# Patient Record
Sex: Female | Born: 1960 | Race: White | Hispanic: No | Marital: Married | Smoking: Never smoker
Health system: Southern US, Community
[De-identification: ages and names within clinical notes are randomized; demographics above are authoritative.]

## PROBLEM LIST (undated history)

## (undated) DIAGNOSIS — M1990 Unspecified osteoarthritis, unspecified site: Secondary | ICD-10-CM

## (undated) DIAGNOSIS — Z853 Personal history of malignant neoplasm of breast: Secondary | ICD-10-CM

## (undated) DIAGNOSIS — M797 Fibromyalgia: Secondary | ICD-10-CM

## (undated) DIAGNOSIS — C50919 Malignant neoplasm of unspecified site of unspecified female breast: Secondary | ICD-10-CM

## (undated) DIAGNOSIS — M5430 Sciatica, unspecified side: Secondary | ICD-10-CM

## (undated) DIAGNOSIS — C50419 Malignant neoplasm of upper-outer quadrant of unspecified female breast: Secondary | ICD-10-CM

## (undated) DIAGNOSIS — Z1239 Encounter for other screening for malignant neoplasm of breast: Secondary | ICD-10-CM

## (undated) DIAGNOSIS — G5600 Carpal tunnel syndrome, unspecified upper limb: Secondary | ICD-10-CM

## (undated) HISTORY — DX: Malignant neoplasm of unspecified site of unspecified female breast: C50.919

## (undated) HISTORY — DX: Fibromyalgia: M79.7

## (undated) HISTORY — DX: Personal history of malignant neoplasm of breast: Z85.3

## (undated) HISTORY — DX: Sciatica, unspecified side: M54.30

## (undated) HISTORY — DX: Carpal tunnel syndrome, unspecified upper limb: G56.00

## (undated) HISTORY — DX: Encounter for other screening for malignant neoplasm of breast: Z12.39

## (undated) HISTORY — DX: Malignant neoplasm of upper-outer quadrant of unspecified female breast: C50.419

## (undated) HISTORY — DX: Unspecified osteoarthritis, unspecified site: M19.90

---

## 1999-11-18 DIAGNOSIS — M797 Fibromyalgia: Secondary | ICD-10-CM

## 1999-11-18 DIAGNOSIS — G5600 Carpal tunnel syndrome, unspecified upper limb: Secondary | ICD-10-CM

## 1999-11-18 HISTORY — PX: CARPAL TUNNEL RELEASE: SHX101

## 1999-11-18 HISTORY — DX: Carpal tunnel syndrome, unspecified upper limb: G56.00

## 1999-11-18 HISTORY — DX: Fibromyalgia: M79.7

## 2000-04-27 ENCOUNTER — Encounter: Admission: RE | Admit: 2000-04-27 | Discharge: 2000-07-26 | Payer: Self-pay | Admitting: Family Medicine

## 2000-05-27 ENCOUNTER — Other Ambulatory Visit: Admission: RE | Admit: 2000-05-27 | Discharge: 2000-05-27 | Payer: Self-pay | Admitting: Family Medicine

## 2001-10-12 ENCOUNTER — Other Ambulatory Visit: Admission: RE | Admit: 2001-10-12 | Discharge: 2001-10-12 | Payer: Self-pay | Admitting: Family Medicine

## 2001-12-03 ENCOUNTER — Ambulatory Visit (HOSPITAL_COMMUNITY): Admission: RE | Admit: 2001-12-03 | Discharge: 2001-12-03 | Payer: Self-pay

## 2001-12-03 ENCOUNTER — Encounter: Payer: Self-pay | Admitting: Family Medicine

## 2002-02-18 ENCOUNTER — Encounter: Payer: Self-pay | Admitting: Family Medicine

## 2002-02-18 ENCOUNTER — Ambulatory Visit (HOSPITAL_COMMUNITY): Admission: RE | Admit: 2002-02-18 | Discharge: 2002-02-18 | Payer: Self-pay | Admitting: Family Medicine

## 2002-11-17 DIAGNOSIS — M1990 Unspecified osteoarthritis, unspecified site: Secondary | ICD-10-CM

## 2002-11-17 HISTORY — DX: Unspecified osteoarthritis, unspecified site: M19.90

## 2009-11-17 DIAGNOSIS — C50419 Malignant neoplasm of upper-outer quadrant of unspecified female breast: Secondary | ICD-10-CM

## 2009-11-17 DIAGNOSIS — Z853 Personal history of malignant neoplasm of breast: Secondary | ICD-10-CM

## 2009-11-17 HISTORY — DX: Personal history of malignant neoplasm of breast: Z85.3

## 2009-11-17 HISTORY — DX: Malignant neoplasm of upper-outer quadrant of unspecified female breast: C50.419

## 2009-11-17 HISTORY — PX: BREAST SURGERY: SHX581

## 2009-12-27 ENCOUNTER — Ambulatory Visit: Payer: Self-pay | Admitting: Family Medicine

## 2010-01-09 ENCOUNTER — Ambulatory Visit: Payer: Self-pay | Admitting: General Surgery

## 2010-01-10 ENCOUNTER — Ambulatory Visit: Payer: Self-pay | Admitting: General Surgery

## 2010-01-15 ENCOUNTER — Ambulatory Visit: Payer: Self-pay | Admitting: Oncology

## 2010-01-24 ENCOUNTER — Ambulatory Visit: Payer: Self-pay | Admitting: Oncology

## 2010-02-15 ENCOUNTER — Ambulatory Visit: Payer: Self-pay | Admitting: Oncology

## 2010-03-17 ENCOUNTER — Ambulatory Visit: Payer: Self-pay | Admitting: Oncology

## 2010-04-17 ENCOUNTER — Ambulatory Visit: Payer: Self-pay | Admitting: Oncology

## 2010-05-17 ENCOUNTER — Ambulatory Visit: Payer: Self-pay | Admitting: Oncology

## 2010-06-17 ENCOUNTER — Ambulatory Visit: Payer: Self-pay | Admitting: Oncology

## 2010-06-27 ENCOUNTER — Ambulatory Visit: Payer: Self-pay | Admitting: General Surgery

## 2010-09-12 ENCOUNTER — Ambulatory Visit: Payer: Self-pay | Admitting: Oncology

## 2010-09-17 ENCOUNTER — Ambulatory Visit: Payer: Self-pay | Admitting: Oncology

## 2010-11-17 DIAGNOSIS — M5430 Sciatica, unspecified side: Secondary | ICD-10-CM

## 2010-11-17 HISTORY — DX: Sciatica, unspecified side: M54.30

## 2010-12-11 ENCOUNTER — Ambulatory Visit: Payer: Self-pay | Admitting: Radiation Oncology

## 2010-12-18 ENCOUNTER — Ambulatory Visit: Payer: Self-pay | Admitting: Radiation Oncology

## 2011-01-02 ENCOUNTER — Ambulatory Visit: Payer: Self-pay | Admitting: General Surgery

## 2011-04-17 ENCOUNTER — Ambulatory Visit: Payer: Self-pay | Admitting: Physical Medicine and Rehabilitation

## 2011-08-04 ENCOUNTER — Ambulatory Visit: Payer: Self-pay | Admitting: General Surgery

## 2011-08-06 ENCOUNTER — Ambulatory Visit: Payer: Self-pay | Admitting: General Surgery

## 2012-02-23 ENCOUNTER — Ambulatory Visit: Payer: Self-pay | Admitting: General Surgery

## 2012-04-14 ENCOUNTER — Ambulatory Visit: Payer: Self-pay | Admitting: Family Medicine

## 2012-07-28 ENCOUNTER — Ambulatory Visit: Payer: Self-pay | Admitting: Family Medicine

## 2012-08-06 ENCOUNTER — Ambulatory Visit: Payer: Self-pay | Admitting: Family Medicine

## 2012-08-30 ENCOUNTER — Ambulatory Visit: Payer: Self-pay | Admitting: General Surgery

## 2012-10-27 ENCOUNTER — Ambulatory Visit: Payer: Self-pay | Admitting: Oncology

## 2013-01-21 ENCOUNTER — Encounter: Payer: Self-pay | Admitting: General Surgery

## 2013-02-24 ENCOUNTER — Ambulatory Visit: Payer: Self-pay | Admitting: General Surgery

## 2013-02-24 ENCOUNTER — Encounter: Payer: Self-pay | Admitting: General Surgery

## 2013-03-07 ENCOUNTER — Ambulatory Visit (INDEPENDENT_AMBULATORY_CARE_PROVIDER_SITE_OTHER): Payer: Managed Care, Other (non HMO) | Admitting: General Surgery

## 2013-03-07 ENCOUNTER — Ambulatory Visit: Payer: Self-pay | Admitting: General Surgery

## 2013-03-07 ENCOUNTER — Other Ambulatory Visit: Payer: Self-pay | Admitting: *Deleted

## 2013-03-07 ENCOUNTER — Encounter: Payer: Self-pay | Admitting: General Surgery

## 2013-03-07 ENCOUNTER — Encounter: Payer: Self-pay | Admitting: *Deleted

## 2013-03-07 VITALS — BP 106/60 | HR 84 | Resp 12 | Ht 64.0 in | Wt 158.0 lb

## 2013-03-07 DIAGNOSIS — Z853 Personal history of malignant neoplasm of breast: Secondary | ICD-10-CM

## 2013-03-07 DIAGNOSIS — R920 Mammographic microcalcification found on diagnostic imaging of breast: Secondary | ICD-10-CM

## 2013-03-07 NOTE — Progress Notes (Signed)
The patient has been asked to return to the office in six months for a unilateral left breast diagnostic mammogram.  

## 2013-03-07 NOTE — Patient Instructions (Addendum)
Return in 6 months

## 2013-03-07 NOTE — Progress Notes (Signed)
the Patient ID: [name], female   DOB: [birth year], 52 y.o.   MRN: [id number]  Chief Complaint  Patient presents with  . Follow-up    mammogram    HPI Michelle Dawson is a 52 y.o. female here today for her follow up mammogram done 02/24/13 cat 3. Patient have had a left breast wide excision 2011. Patient feels no lumps and is not having any new problems. HPI  Past Medical History  Diagnosis Date  . 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 The area of DCIS spanning about 3.5 cm in diameter. Margins were negative I 0.5 cm for the invasive cancer in less than 1 mm from the DCIS in multiple locations.  . Carpal tunnel syndrome 2001    nerve damage both arms  . Fibromyalgia 2001  . Sciatica 2012    Dr. Elease Hashimoto referred patient to pain clinic  . Arthritis 2004  . Breast screening, unspecified   . Malignant neoplasm of upper-outer quadrant of female breast 2011    treated with left breast wide excision, mastoplasty, and sentinel node biopsy for a T1b, N0, M0; The patient Oncotype score was low at 8% with hormone treatment alone. She did not receive adjuvant chemotherapy. She has been managed with tamoxifen alone.    Past Surgical History  Procedure Laterality Date  . Carpal tunnel release  2001  . Breast surgery Left 2011    left breast wide excision, mastoplasty, and sentinel node biopsy    Family History  Problem Relation Age of Onset  . Melanoma Father     malignant melanoma    Social History History  Substance Use Topics  . Smoking status: Never Smoker   . Smokeless tobacco: Never Used  . Alcohol Use: No    No Known Allergies  Current Outpatient Prescriptions  Medication Sig Dispense Refill  . Ascorbic Acid (VITAMIN C WITH ROSE HIPS) 1000 MG tablet Take 1,000 mg by mouth 2 (two) times daily.      . Calcium Carbonate-Vitamin D  (CALCIUM 500 + D) 500-125 MG-UNIT TABS Take by mouth daily.      . Cetirizine-Pseudoephedrine (ZYRTEC-D PO) Take by mouth.      . Cholecalciferol (VITAMIN D) 2000 UNITS tablet Take 2,000 Units by mouth daily.      . cyclobenzaprine (FLEXERIL) 5 MG tablet Take 5 mg by mouth daily.      . folic acid (FOLVITE) 400 MCG tablet Take 400 mcg by mouth daily.      . hydrocodone-acetaminophen (LORCET-HD) 5-500 MG per capsule Take 1 capsule by mouth 2 (two) times daily.      . IRON PO Take by mouth.      . L-Lysine 1000 MG TABS Take by mouth 2 (two) times daily.      . Multiple Vitamins-Minerals (WOMENS DAILY FORMULA PO) Take by mouth.      . tamoxifen (NOLVADEX) 20 MG tablet Take 20 mg by mouth daily.      . vitamin B-12 (CYANOCOBALAMIN) 1000 MCG tablet Take 1,000 mcg by mouth daily.      . vitamin E 400 UNIT capsule Take 400 Units by mouth daily.      Marland Kitchen zolpidem (AMBIEN CR) 12.5 MG CR tablet Take 12.5 mg by mouth daily.       No current facility-administered medications for this visit.    Review of Systems  Review of Systems  Constitutional: Negative.   Respiratory: Negative.   Cardiovascular: Negative.     Blood pressure 106/60, pulse 84, resp. rate 12, height 5\' 4"  (1.626 m), weight 158 lb (71.668 kg), last menstrual period 02/08/2013.  Physical Exam Physical Exam  Constitutional: She appears well-developed and well-nourished.  Neck: Normal range of motion. Neck supple.  Cardiovascular: Normal rate, regular rhythm and normal heart sounds.   Pulmonary/Chest: Effort normal and breath sounds normal. Right breast exhibits no inverted nipple, no mass, no nipple discharge, no skin change and no tenderness. Left breast exhibits no inverted nipple, no mass, no nipple discharge, no skin change and no tenderness.  Left breat thickening in the center of the scar.  Lymphadenopathy:    She has no cervical adenopathy.    She has no axillary adenopathy.    Data Reviewed Bilateral mammograms dated  February 24, 2013 showed developing calcifications in the area of treatment in the upper outer quadrant left breast most likely secondary to fat necrosis. BI-RAD-3.  Assessment    Benign breast exam. Radiologic changes secondary to surgery and radiation.     Plan    A 6 month followup mammogram of the left breast as been recommended. The patient has reluctantly agreed. She'll continue her present antiestrogen therapy.        [name] 03/08/2013, 9:45 PM  Cc: Lorie Phenix, M.D.; Carmina Miller, M.D.

## 2013-03-08 ENCOUNTER — Encounter: Payer: Self-pay | Admitting: General Surgery

## 2013-09-01 ENCOUNTER — Ambulatory Visit: Payer: Self-pay | Admitting: General Surgery

## 2013-09-02 ENCOUNTER — Telehealth: Payer: Self-pay | Admitting: *Deleted

## 2013-09-02 NOTE — Telephone Encounter (Signed)
Patient back to say that she doesn't think that she can come in prior to her scheduled appointment on 09-19-13 at 4:30 pm due to kids with doctor's appointments/home school/etc. She would like for you to review her films and if it is an emergency she could see if her husband could take a day off of work.

## 2013-09-02 NOTE — Telephone Encounter (Signed)
Message for patient to call the office.   Patient may need left breast stereo biopsy (she is unaware of this at this time). Want to see if patient could move office appointment up to 09-07-13 in the morning.

## 2013-09-05 ENCOUNTER — Telehealth: Payer: Self-pay | Admitting: General Surgery

## 2013-09-05 ENCOUNTER — Telehealth: Payer: Self-pay | Admitting: *Deleted

## 2013-09-05 NOTE — Telephone Encounter (Signed)
Patient has been scheduled for a left breast stereotactic biopsy at [facility] for 09-12-13 at 4 pm. She will check-in at the [facility] at 3:30 pm. This patient is aware of date, time, and instructions. Patient verbalizes understanding.  Office visit prior to stereo biopsy will not be required per Dr. Lemar Livings due to patient's demanding schedule.

## 2013-09-05 NOTE — Telephone Encounter (Signed)
Patient asked to call back to review recent mammograms.

## 2013-09-12 ENCOUNTER — Ambulatory Visit: Payer: Self-pay | Admitting: General Surgery

## 2013-09-12 DIAGNOSIS — R920 Mammographic microcalcification found on diagnostic imaging of breast: Secondary | ICD-10-CM

## 2013-09-12 HISTORY — PX: BREAST BIOPSY: SHX20

## 2013-09-13 ENCOUNTER — Ambulatory Visit: Payer: Managed Care, Other (non HMO) | Admitting: General Surgery

## 2013-09-13 ENCOUNTER — Encounter: Payer: Self-pay | Admitting: General Surgery

## 2013-09-14 ENCOUNTER — Encounter: Payer: Self-pay | Admitting: General Surgery

## 2013-09-14 ENCOUNTER — Telehealth: Payer: Self-pay | Admitting: General Surgery

## 2013-09-14 ENCOUNTER — Telehealth: Payer: Self-pay | Admitting: *Deleted

## 2013-09-14 LAB — PATHOLOGY REPORT

## 2013-09-14 NOTE — Telephone Encounter (Signed)
Notified biopsy showed recurrent DCIS. We will meet tomorrow PM to review options.

## 2013-09-14 NOTE — Telephone Encounter (Signed)
Dr. 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 Dr. Forde Dandy is not sure if we need to repeat ER/PR. She will leave that up to you and the medical oncologist. This will be a repeat from 3 years ago but states it may be beneficial. Dr. Forde Dandy will wait to hear back from you.

## 2013-09-15 ENCOUNTER — Encounter: Payer: Self-pay | Admitting: General Surgery

## 2013-09-15 ENCOUNTER — Ambulatory Visit (INDEPENDENT_AMBULATORY_CARE_PROVIDER_SITE_OTHER): Payer: Managed Care, Other (non HMO) | Admitting: General Surgery

## 2013-09-15 VITALS — BP 110/72 | HR 62 | Resp 12 | Ht 63.0 in | Wt 150.0 lb

## 2013-09-15 DIAGNOSIS — D0512 Intraductal carcinoma in situ of left breast: Secondary | ICD-10-CM

## 2013-09-15 DIAGNOSIS — Z853 Personal history of malignant neoplasm of breast: Secondary | ICD-10-CM

## 2013-09-15 DIAGNOSIS — D0590 Unspecified type of carcinoma in situ of unspecified breast: Secondary | ICD-10-CM

## 2013-09-15 NOTE — Progress Notes (Signed)
Patient ID: Michelle Dawson, female   DOB: 1961-01-19, 52 y.o.   MRN: [id number]  Chief Complaint  Patient presents with  . Follow-up    discussion    HPI Michelle Dawson is a 52 y.o. female.  Here today for post biopsy discussion.     HPI  Past Medical History  Diagnosis Date  . 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 The area of DCIS spanning about 3.5 cm in diameter. Margins were negative I 0.5 cm for the invasive cancer in less than 1 mm from the DCIS in multiple locations.  . Carpal tunnel syndrome 2001    nerve damage both arms  . Fibromyalgia 2001  . Sciatica 2012    Dr. Elease Dawson referred patient to pain clinic  . Arthritis 2004  . Breast screening, unspecified   . Malignant neoplasm of upper-outer quadrant of female breast 2011    treated with left breast wide excision, mastoplasty, and sentinel node biopsy for a T1b, N0, M0; The patient Oncotype score was low at 8% with hormone treatment alone. She did not receive adjuvant chemotherapy. She has been managed with tamoxifen alone.  . 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    Past Surgical History  Procedure Laterality Date  . Carpal tunnel release  2001  . Breast surgery Left 2011    left breast wide excision, mastoplasty, and sentinel node biopsy  . Breast biopsy Left 09/12/2013    Left breast stereotactic    Family History  Problem Relation Age of Onset  . Melanoma Father     malignant melanoma    Social History History  Substance Use Topics  . Smoking status: Never Smoker   . Smokeless tobacco: Never Used  . Alcohol Use: No    No Known Allergies  Current Outpatient Prescriptions  Medication Sig Dispense Refill  . Coenzyme Q10 (CO Q 10 PO) Take 1 tablet by mouth daily.      .  Ascorbic Acid (VITAMIN C WITH ROSE HIPS) 1000 MG tablet Take 1,000 mg by mouth 2 (two) times daily.      . Calcium Carbonate-Vitamin D (CALCIUM 500 + D) 500-125 MG-UNIT TABS Take by mouth daily.      . Cetirizine-Pseudoephedrine (ZYRTEC-D PO) Take by mouth.      . Cholecalciferol (VITAMIN D) 2000 UNITS tablet Take 2,000 Units by mouth daily.      . cyclobenzaprine (FLEXERIL) 5 MG tablet Take 5 mg by mouth daily.      . folic acid (FOLVITE) 400 MCG tablet Take 400 mcg by mouth daily.      . hydrocodone-acetaminophen (LORCET-HD) 5-500 MG per capsule Take 1 capsule by mouth 2 (two) times daily.      . IRON PO Take by mouth.      . L-Lysine 1000 MG TABS Take by mouth 2 (two) times daily.      . Multiple Vitamins-Minerals (WOMENS DAILY FORMULA PO) Take by mouth.      . tamoxifen (NOLVADEX) 20 MG tablet Take 20 mg by mouth daily.      . vitamin B-12 (CYANOCOBALAMIN) 1000 MCG tablet Take 1,000 mcg by mouth daily.      . vitamin E 400 UNIT capsule Take 400 Units by mouth daily.      Marland Kitchen zolpidem (  AMBIEN CR) 12.5 MG CR tablet Take 12.5 mg by mouth daily.       No current facility-administered medications for this visit.    Review of Systems Review of Systems  Constitutional: Negative.   Respiratory: Negative.   Cardiovascular: Negative.     Blood pressure 110/72, pulse 62, resp. rate 12, height 5\' 3"  (1.6 m), weight 150 lb (68.04 kg).  Physical Exam Physical Exam Steri strips intact.  No bruising noted.  Data Reviewed High-grade DCIS.  Assessment    Recurrent breast cancer, 3 years status post partial mastectomy and MammoSite treatment. Lower current score by Oncotype testing.    Plan    The patient is encouraged to have a mastectomy because of the recurrent DCIS. Sentinel node biopsy should be repeated at the same time in the event a second invasive component is identified. (At the time of her 12/31/2009 biopsy for a palpable mass high-grade DCIS was identified with comedonecrosis. No  invasive cancer was identified on the original sample. Wide excision she was found to have a 1 cm invasive cancer.)  The patient desires immediate breast reconstruction. She will explore her options with the plastic surgery service. She is an avid runner, and this may have an impact on reconstructive techniques.  She initially talked about postponing surgery until after the school year ( summer 2015). In light of her previous upstaging on wide excision, I would be uncomfortable at this time delay. If she wants to wait until the holidays when her older children are home from school to help care for her elderly parents who live with her, this would not be an appropriate.       Michelle Dawson 09/17/2013, 10:42 AM

## 2013-09-16 ENCOUNTER — Telehealth: Payer: Self-pay | Admitting: *Deleted

## 2013-09-16 NOTE — Telephone Encounter (Signed)
Dr. Margarita Sermons office has been contacted regarding an appointment for this patient. Records have been faxed and received per [facility]. Their office will be contacting patient with an appointment day and time.

## 2013-09-16 NOTE — Telephone Encounter (Signed)
Message copied by Nicholes Mango on Fri Sep 16, 2013  8:57 AM ------      Message from: [place], [place]      Created: Thu Sep 15, 2013  9:12 PM       Patient needs to see Dr. Meriam Sprague re: immediate reconstruction post mastectomy.  Please contact his office so they can contact her. Thanks ------

## 2013-09-17 ENCOUNTER — Encounter: Payer: Self-pay | Admitting: General Surgery

## 2013-09-17 DIAGNOSIS — D0510 Intraductal carcinoma in situ of unspecified breast: Secondary | ICD-10-CM | POA: Insufficient documentation

## 2013-09-17 DIAGNOSIS — Z853 Personal history of malignant neoplasm of breast: Secondary | ICD-10-CM | POA: Insufficient documentation

## 2013-09-19 ENCOUNTER — Ambulatory Visit: Payer: Managed Care, Other (non HMO)

## 2013-09-29 ENCOUNTER — Telehealth: Payer: Self-pay | Admitting: *Deleted

## 2013-09-29 NOTE — Telephone Encounter (Signed)
Message for patient to call the office.   Patient's surgery has been scheduled for 10-26-13 at [facility].

## 2013-10-04 ENCOUNTER — Telehealth: Payer: Self-pay | Admitting: *Deleted

## 2013-10-04 NOTE — Telephone Encounter (Signed)
Patient's left mastectomy with SLN biopsy and reconstruction by Dr. Meriam Sprague has been scheduled for 10-26-13 at [facility]. Instructions were reviewed with the patient. She will come to the office for a pre-op visit on 10-17-13.

## 2013-10-17 ENCOUNTER — Other Ambulatory Visit: Payer: Self-pay | Admitting: General Surgery

## 2013-10-17 ENCOUNTER — Encounter: Payer: Self-pay | Admitting: General Surgery

## 2013-10-17 ENCOUNTER — Ambulatory Visit (INDEPENDENT_AMBULATORY_CARE_PROVIDER_SITE_OTHER): Payer: Managed Care, Other (non HMO) | Admitting: General Surgery

## 2013-10-17 VITALS — BP 110/68 | HR 74 | Resp 12 | Ht 63.0 in | Wt 138.0 lb

## 2013-10-17 DIAGNOSIS — Z853 Personal history of malignant neoplasm of breast: Secondary | ICD-10-CM

## 2013-10-17 DIAGNOSIS — D0512 Intraductal carcinoma in situ of left breast: Secondary | ICD-10-CM

## 2013-10-17 DIAGNOSIS — D0590 Unspecified type of carcinoma in situ of unspecified breast: Secondary | ICD-10-CM

## 2013-10-17 NOTE — Patient Instructions (Signed)
Patient scheduled for surgery for 10/26/13 for left breast mastectomy with reconstruction.

## 2013-10-17 NOTE — Progress Notes (Signed)
Patient ID: Michelle Dawson, female   DOB: 1961/08/21, 52 y.o.   MRN: [id number]  Chief Complaint  Patient presents with  . Pre-op Exam    left mastectomy    HPI Michelle Dawson is a 52 y.o. female here today for her pre op left mastectomy with reconstruction . Surgery is scheduled for 10/26/13.  The patient has been evaluated by [name], MD from plastic surgery service. Latissimus dorsi flap reconstruction as been recommended for the left breast. Plans are for a nipple sparing mastectomy, sentinel node biopsy and contralateral mastoplasty. HPI  Past Medical History  Diagnosis Date  . 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 The area of DCIS spanning about 3.5 cm in diameter. Margins were negative I 0.5 cm for the invasive cancer in less than 1 mm from the DCIS in multiple locations.  . Carpal tunnel syndrome 2001    nerve damage both arms  . Fibromyalgia 2001  . Sciatica 2012    Dr. Elease Hashimoto referred patient to pain clinic  . Arthritis 2004  . Breast screening, unspecified   . Malignant neoplasm of upper-outer quadrant of female breast 2011    treated with left breast wide excision, mastoplasty, and sentinel node biopsy for a T1b, N0, M0; The patient Oncotype score was low at 8% with hormone treatment alone. She did not receive adjuvant chemotherapy. She has been managed with tamoxifen alone.  . 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    Past Surgical History  Procedure Laterality Date  . Carpal tunnel release  2001  . Breast surgery Left 2011    left breast wide excision, mastoplasty, and sentinel node biopsy  . Breast biopsy Left 09/12/2013    Left breast stereotactic    Family History  Problem Relation Age of Onset  . Melanoma Father     malignant  melanoma    Social History History  Substance Use Topics  . Smoking status: Never Smoker   . Smokeless tobacco: Never Used  . Alcohol Use: No    No Known Allergies  Current Outpatient Prescriptions  Medication Sig Dispense Refill  . Ascorbic Acid (VITAMIN C WITH ROSE HIPS) 1000 MG tablet Take 1,000 mg by mouth 2 (two) times daily.      . Calcium Carbonate-Vitamin D (CALCIUM 500 + D) 500-125 MG-UNIT TABS Take by mouth daily.      . Cetirizine-Pseudoephedrine (ZYRTEC-D PO) Take by mouth.      . Cholecalciferol (VITAMIN D) 2000 UNITS tablet Take 2,000 Units by mouth daily.      . Coenzyme Q10 (CO Q 10 PO) Take 1 tablet by mouth daily.      . cyclobenzaprine (FLEXERIL) 5 MG tablet Take 5 mg by mouth daily.      . folic acid (FOLVITE) 400 MCG tablet Take 400 mcg by mouth daily.      . hydrocodone-acetaminophen (LORCET-HD) 5-500 MG per capsule Take 1 capsule by mouth 2 (two) times daily.      . IRON PO Take by mouth.      . L-Lysine 1000 MG TABS Take by mouth 2 (two) times daily.      . Multiple Vitamins-Minerals (WOMENS DAILY FORMULA PO) Take by mouth.      . tamoxifen (NOLVADEX) 20 MG tablet Take 20 mg  by mouth daily.      . vitamin B-12 (CYANOCOBALAMIN) 1000 MCG tablet Take 1,000 mcg by mouth daily.      . vitamin E 400 UNIT capsule Take 400 Units by mouth daily.      Marland Kitchen zolpidem (AMBIEN CR) 12.5 MG CR tablet Take 12.5 mg by mouth daily.       No current facility-administered medications for this visit.    Review of Systems Review of Systems  Constitutional: Negative.   Respiratory: Negative.   Cardiovascular: Negative.     Blood pressure 110/68, pulse 74, resp. rate 12, height 5\' 3"  (1.6 m), weight 138 lb (62.596 kg), last menstrual period 09/28/2013.  Physical Exam Physical Exam  Vitals reviewed. Constitutional: She is oriented to person, place, and time. She appears well-developed and well-nourished.  Eyes: No scleral icterus.  Cardiovascular: Normal rate, regular  rhythm and normal heart sounds.   Pulmonary/Chest: Breath sounds normal. Right breast exhibits no inverted nipple, no mass, no nipple discharge, no skin change and no tenderness. Left breast exhibits no inverted nipple, no mass, no nipple discharge, no skin change and no tenderness.  left breast Well healed incision  Abdominal: Soft. Normal appearance and bowel sounds are normal. There is no tenderness.  Lymphadenopathy:    She has no cervical adenopathy.    She has no axillary adenopathy.  Neurological: She is alert and oriented to person, place, and time.  Skin: Skin is warm and dry.    Data Reviewed Her most recent biopsy showed evidence of high-grade DCIS. Original pathology showed DCIS and one wide excision of foci of invasive cancer. Low Oncotype Dx assay  in the past. ER/PR positive.  Assessment    Recurrent left breast DCIS.     Plan    The indication for mastectomy was reviewed. Intraoperative frozen section will be obtained of the retroareolar tissue. If the nipple/areolar tissue was involved with DCIS the nipple will not be spared. The roll for repeat sentinel node biopsy in light of her previous DCIS/invasive findings was reviewed.  She had several questions regarding activity limitation post reconstruction. She was encouraged to review these at the time of her upcoming appointment with Dr.Coan on October 19, 2013.        [name] 10/17/2013, 9:29 PM

## 2013-10-20 ENCOUNTER — Ambulatory Visit: Payer: Self-pay | Admitting: General Surgery

## 2013-10-20 ENCOUNTER — Encounter: Payer: Self-pay | Admitting: General Surgery

## 2013-10-20 HISTORY — PX: RECONSTRUCTION BREAST W/ LATISSIMUS DORSI FLAP: SUR1078

## 2013-10-20 LAB — URINALYSIS, COMPLETE
Bilirubin,UR: NEGATIVE
Blood: NEGATIVE
Ketone: NEGATIVE
Nitrite: NEGATIVE
Protein: NEGATIVE
RBC,UR: 1 /HPF (ref 0–5)
Specific Gravity: 1.006 (ref 1.003–1.030)
Squamous Epithelial: 6
WBC UR: 2 /HPF (ref 0–5)

## 2013-10-20 LAB — URINALYSIS
Blood, UA: NEGATIVE
Ketones, UA: NEGATIVE
Nitrite, UA: NEGATIVE
Protein, UA: NEGATIVE
Specific Gravity, UA: 1.006 (ref 1.005–1.030)

## 2013-10-20 LAB — CBC
HCT: 36.3 % (ref 35.0–47.0)
HGB: 12.5 g/dL (ref 12.0–16.0)
MCH: 31.5 pg (ref 26.0–34.0)
MCHC: 34.4 g/dL (ref 32.0–36.0)
MCV: 92 fL (ref 80–100)
RBC: 3.97 10*6/uL (ref 3.80–5.20)
RDW: 13.3 % (ref 11.5–14.5)
WBC: 5.4 10*3/uL (ref 3.6–11.0)

## 2013-10-20 LAB — LAB REPORT - SCANNED
CO2: 30 mmol/L — AB (ref 13–22)
Calcium: 9.4 mg/dL (ref 8.7–10.7)
Glucose: 94 mg/dL
HCT: 36.3
MCH: 31.5 pg (ref 26.0–34.0)
MCHC: 34 g/dL (ref 30–37)
MCV: 92 fL (ref 82.0–108.0)
Platelets: 193
Potassium: 3.6 mmol/L (ref 3.4–5.3)
RBC: 3.97 10*6/uL — AB (ref 4.00–5.20)
Sodium: 141 mmol/L (ref 137–147)
WBC: 5.4 10^3/mL

## 2013-10-20 LAB — BASIC METABOLIC PANEL
BUN: 7 mg/dL (ref 7–18)
Chloride: 108 mmol/L — ABNORMAL HIGH (ref 98–107)
Co2: 30 mmol/L (ref 21–32)
EGFR (African American): >60
EGFR (Non-African Amer.): >60
Osmolality: 279 (ref 275–301)
Potassium: 3.6 mmol/L (ref 3.5–5.1)

## 2013-10-26 ENCOUNTER — Inpatient Hospital Stay: Payer: Self-pay | Admitting: General Surgery

## 2013-10-26 DIAGNOSIS — D0590 Unspecified type of carcinoma in situ of unspecified breast: Secondary | ICD-10-CM

## 2013-10-26 DIAGNOSIS — Z853 Personal history of malignant neoplasm of breast: Secondary | ICD-10-CM

## 2013-10-26 HISTORY — PX: BREAST RECONSTRUCTION: SHX9

## 2013-10-26 LAB — ER/PR,IMMUNOHISTOCHEM,PARAFFIN: Progesterone Recp IP: 70

## 2013-10-27 ENCOUNTER — Encounter: Payer: Self-pay | Admitting: General Surgery

## 2013-10-30 ENCOUNTER — Telehealth: Payer: Self-pay | Admitting: General Surgery

## 2013-10-30 NOTE — Telephone Encounter (Signed)
The patient was notified that the breast specimen showed DCIS only. No invasive component. Margins were clear.  She reports doing well. Her husband describes it $0.25/50 cent area of discoloration below the latissimus flap site. Unclear whether this is ecchymosis or hematoma. If it increases she was encouraged to call tomorrow for assessment.

## 2013-11-02 ENCOUNTER — Encounter: Payer: Self-pay | Admitting: General Surgery

## 2013-11-03 ENCOUNTER — Encounter: Payer: Self-pay | Admitting: General Surgery

## 2013-11-08 ENCOUNTER — Ambulatory Visit (INDEPENDENT_AMBULATORY_CARE_PROVIDER_SITE_OTHER): Payer: Managed Care, Other (non HMO) | Admitting: General Surgery

## 2013-11-08 ENCOUNTER — Encounter: Payer: Self-pay | Admitting: General Surgery

## 2013-11-08 VITALS — BP 124/78 | HR 74 | Resp 12 | Ht 63.0 in | Wt 152.0 lb

## 2013-11-08 DIAGNOSIS — D0512 Intraductal carcinoma in situ of left breast: Secondary | ICD-10-CM

## 2013-11-08 DIAGNOSIS — D0590 Unspecified type of carcinoma in situ of unspecified breast: Secondary | ICD-10-CM

## 2013-11-08 NOTE — Patient Instructions (Signed)
Patient to return ion one month.  

## 2013-11-08 NOTE — Progress Notes (Signed)
Patient ID: Michelle Dawson, female   DOB: Dec 25, 1960, 52 y.o.   MRN: [id number]  Chief Complaint  Patient presents with  . Routine Post Op    HPI Michelle Dawson is a 52 y.o. female female here today for her post op left mastectomy with reconstruction  done 10/26/13. Patient states Michelle Asp, MD  Removed her sutures and drains yesterday .    HPI  Past Medical History  Diagnosis Date  . 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 The area of DCIS spanning about 3.5 cm in diameter. Margins were negative I 0.5 cm for the invasive cancer in less than 1 mm from the DCIS in multiple locations.  . Carpal tunnel syndrome 2001    nerve damage both arms  . Fibromyalgia 2001  . Sciatica 2012    Dr. Elease Hashimoto referred patient to pain clinic  . Arthritis 2004  . Breast screening, unspecified   . Malignant neoplasm of upper-outer quadrant of female breast 2011    treated with left breast wide excision, mastoplasty, and sentinel node biopsy for a T1b, N0, M0; The patient Oncotype score was low at 8% with hormone treatment alone. She did not receive adjuvant chemotherapy. She has been managed with tamoxifen alone.  . 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    Past Surgical History  Procedure Laterality Date  . Carpal tunnel release  2001  . Breast surgery Left 2011    left breast wide excision, mastoplasty, and sentinel node biopsy  . Breast biopsy Left 09/12/2013    Left breast stereotactic  . Breast reconstruction Left 10/26/13    Family History  Problem Relation Age of Onset  . Melanoma Father     malignant melanoma    Social History History  Substance Use Topics  . Smoking status: Never Smoker   . Smokeless tobacco: Never Used  . Alcohol Use: No    No Known  Allergies  Current Outpatient Prescriptions  Medication Sig Dispense Refill  . Ascorbic Acid (VITAMIN C WITH ROSE HIPS) 1000 MG tablet Take 1,000 mg by mouth 2 (two) times daily.      . Calcium Carbonate-Vitamin D (CALCIUM 500 + D) 500-125 MG-UNIT TABS Take by mouth daily.      . cephALEXin (KEFLEX) 500 MG capsule Take 1 capsule by mouth 2 (two) times daily.      . Cetirizine-Pseudoephedrine (ZYRTEC-D PO) Take by mouth.      . Cholecalciferol (VITAMIN D) 2000 UNITS tablet Take 2,000 Units by mouth daily.      . Coenzyme Q10 (CO Q 10 PO) Take 1 tablet by mouth daily.      . cyclobenzaprine (FLEXERIL) 5 MG tablet Take 5 mg by mouth daily.      . folic acid (FOLVITE) 400 MCG tablet Take 400 mcg by mouth daily.      . hydrocodone-acetaminophen (LORCET-HD) 5-500 MG per capsule Take 1 capsule by mouth 2 (two) times daily.      . IRON PO Take by mouth.      . L-Lysine 1000 MG TABS Take by mouth 2 (two) times daily.      . Multiple Vitamins-Minerals (WOMENS DAILY FORMULA PO) Take by mouth.      . tamoxifen (NOLVADEX) 20 MG tablet Take 20 mg by mouth  daily.      . vitamin B-12 (CYANOCOBALAMIN) 1000 MCG tablet Take 1,000 mcg by mouth daily.      . vitamin E 400 UNIT capsule Take 400 Units by mouth daily.      Marland Kitchen zolpidem (AMBIEN CR) 12.5 MG CR tablet Take 12.5 mg by mouth daily.       No current facility-administered medications for this visit.    Review of Systems Review of Systems  Constitutional: Negative.   Respiratory: Negative.   Cardiovascular: Negative.     Blood pressure 124/78, pulse 74, resp. rate 12, height 5\' 3"  (1.6 m), weight 152 lb (68.947 kg), last menstrual period 09/28/2013.  Physical Exam Physical Exam  Constitutional: She is oriented to person, place, and time. She appears well-developed and well-nourished.  Eyes: No scleral icterus.  Pulmonary/Chest:    Lymphadenopathy:    She has no cervical adenopathy.  Neurological: She is alert and oriented to person, place,  and time.  Skin: Skin is warm and dry.    Data Reviewed Pathology showed DCIS.  ER/PR testing has been requested.  Assessment    Doing well status post salvage mastectomy and immediate reconstruction with latissimus dorsi flap.     Plan    We will plan for a followup examination one month.  The patient will continue her tamoxifen based on her original invasive cancer being ER positive.       Michelle Dawson 11/08/2013, 9:09 PM

## 2013-11-21 ENCOUNTER — Encounter: Payer: Self-pay | Admitting: General Surgery

## 2013-12-14 ENCOUNTER — Encounter: Payer: Self-pay | Admitting: General Surgery

## 2013-12-14 ENCOUNTER — Ambulatory Visit (INDEPENDENT_AMBULATORY_CARE_PROVIDER_SITE_OTHER): Payer: 59 | Admitting: General Surgery

## 2013-12-14 VITALS — BP 132/70 | HR 72 | Resp 12 | Ht 63.0 in | Wt 158.0 lb

## 2013-12-14 DIAGNOSIS — Z853 Personal history of malignant neoplasm of breast: Secondary | ICD-10-CM

## 2013-12-14 DIAGNOSIS — D0590 Unspecified type of carcinoma in situ of unspecified breast: Secondary | ICD-10-CM

## 2013-12-14 DIAGNOSIS — D0510 Intraductal carcinoma in situ of unspecified breast: Secondary | ICD-10-CM

## 2013-12-14 NOTE — Patient Instructions (Addendum)
The patient has been asked to have a unilateral right breast diagnostic mammogram at [facility]. This has been arranged for 12-22-13 at 1 pm (arrive 12:50 pm). Message has been left on patient's cell phone with detailed information as requested.   Patient will be placed in the recalls for August 2015 for a unilateral right breast diagnostic mammogram and office visit to follow.

## 2013-12-14 NOTE — Progress Notes (Signed)
Patient ID: Michelle Dawson, female   DOB: 1961-02-07, 53 y.o.   MRN: [id number]  Chief Complaint  Patient presents with  . Follow-up    brerast cancer    HPI Michelle Dawson is a 53 y.o. female  here today for her one month post op left mastectomy with reconstruction done 10/26/13. Patient states she is doing well.   The patient reports that she will be undergoing right breast reduction mammoplasty in February 2015.  She is aware of some focal numbness over the well flank status post harvest of the myocutaneous flap and also over mild bulge at the anterior aspect of the harvest closure site.  HPI  Past Medical History  Diagnosis Date  . 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 The area of DCIS spanning about 3.5 cm in diameter. Margins were negative I 0.5 cm for the invasive cancer in less than 1 mm from the DCIS in multiple locations.  . Carpal tunnel syndrome 2001    nerve damage both arms  . Fibromyalgia 2001  . Sciatica 2012    Dr. [name] referred patient to pain clinic  . Arthritis 2004  . Breast screening, unspecified   . Malignant neoplasm of upper-outer quadrant of female breast 2011    treated with left breast wide excision, mastoplasty, and sentinel node biopsy for a T1b, N0, M0; The patient Oncotype score was low at 8% with hormone treatment alone. She did not receive adjuvant chemotherapy. She has been managed with tamoxifen alone.  . 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    Past Surgical History  Procedure Laterality Date  . Carpal tunnel release  2001  . Breast surgery Left 2011    left breast wide excision, mastoplasty, and sentinel node biopsy  . Breast biopsy Left 09/12/2013    Left breast stereotactic  . Breast reconstruction Left  10/26/13  . Reconstruction breast w/ latissimus dorsi flap Left October 20, 2013    simple mastectomy sentinel node biopsy followed by a reconstruction by Dorthey Sawyer    Family History  Problem Relation Age of Onset  . Melanoma Father     malignant melanoma    Social History History  Substance Use Topics  . Smoking status: Never Smoker   . Smokeless tobacco: Never Used  . Alcohol Use: No    No Known Allergies  Current Outpatient Prescriptions  Medication Sig Dispense Refill  . Ascorbic Acid (VITAMIN C WITH ROSE HIPS) 1000 MG tablet Take 1,000 mg by mouth 2 (two) times daily.      . Calcium Carbonate-Vitamin D (CALCIUM 500 + D) 500-125 MG-UNIT TABS Take by mouth daily.      . cephALEXin (KEFLEX) 500 MG capsule Take 1 capsule by mouth 2 (two) times daily.      . Cetirizine-Pseudoephedrine (ZYRTEC-D PO) Take by mouth.      . Cholecalciferol (VITAMIN D) 2000 UNITS tablet Take 2,000 Units by mouth daily.      . Coenzyme Q10 (CO Q 10 PO) Take 1 tablet by mouth daily.      . cyclobenzaprine (FLEXERIL) 5 MG tablet Take 5 mg by mouth daily.      . folic acid (FOLVITE) 268 MCG tablet Take 400 mcg by mouth daily.      . hydrocodone-acetaminophen (LORCET-HD)  5-500 MG per capsule Take 1 capsule by mouth 2 (two) times daily.      . IRON PO Take by mouth.      . L-Lysine 1000 MG TABS Take by mouth 2 (two) times daily.      . Multiple Vitamins-Minerals (WOMENS DAILY FORMULA PO) Take by mouth.      . tamoxifen (NOLVADEX) 20 MG tablet Take 20 mg by mouth daily.      . vitamin B-12 (CYANOCOBALAMIN) 1000 MCG tablet Take 1,000 mcg by mouth daily.      . vitamin E 400 UNIT capsule Take 400 Units by mouth daily.      Marland Kitchen zolpidem (AMBIEN CR) 12.5 MG CR tablet Take 12.5 mg by mouth daily.       No current facility-administered medications for this visit.    Review of Systems Review of Systems  Constitutional: Negative.   Respiratory: Negative.   Cardiovascular: Negative.     Blood pressure  132/70, pulse 72, resp. rate 12, height 5\' 3"  (1.6 m), weight 158 lb (71.668 kg).  Physical Exam Physical Exam  Constitutional: She is oriented to person, place, and time. She appears well-developed and well-nourished.  Eyes: Conjunctivae are normal.  Cardiovascular: Normal rate, regular rhythm and normal heart sounds.   Pulmonary/Chest: Effort normal and breath sounds normal. Right breast exhibits no inverted nipple, no mass, no nipple discharge, no skin change and no tenderness.    Well healed left breast and doner site.   Neurological: She is alert and oriented to person, place, and time.  Skin: Skin is warm and dry.    Data Reviewed Pathology showed a 10 mm area of high-grade DCIS, no invasive malignancy. Supple no negative.  Assessment    Doing well status post latissimus dorsi flap reconstruction of simple mastectomy site.     Plan    Prior to coming reconstruction/reduction mammoplasty, plain films of the right breast will be obtained.     The patient has been asked to have a unilateral right breast diagnostic mammogram at [facility] prior to 12-26-13. This has been arranged for 12-22-13 at 1 pm (arrive 12:50 pm).  Patient will be placed in the recalls for August 2015 for a unilateral right breast diagnostic mammogram and office visit to follow.    Robert Bellow 12/14/2013, 7:55 PM

## 2013-12-22 ENCOUNTER — Ambulatory Visit: Payer: Self-pay | Admitting: General Surgery

## 2013-12-25 ENCOUNTER — Encounter: Payer: Self-pay | Admitting: General Surgery

## 2013-12-28 ENCOUNTER — Telehealth: Payer: Self-pay

## 2013-12-28 NOTE — Telephone Encounter (Signed)
Notified patient as instructed, patient pleased. Discussed follow-up appointments, and patient does not want to have a repeat mammogram in August. She wants to wait and have her mammogram done in one year due to insurance not paying for a 6 month follow up mammogram. I let her know that I would let the doctor know about this and get back with her.

## 2013-12-28 NOTE — Telephone Encounter (Signed)
Message copied by Lesly Rubenstein on Wed Dec 28, 2013 11:05 AM ------      Message from: Robert Bellow      Created: Tue Dec 27, 2013  3:50 PM       Please let the patient know the recent right mammogram was fine. Thanks.      ----- Message -----         From: Verlene Mayer, CMA         Sent: 12/25/2013   3:01 PM           To: Robert Bellow, MD                   ------

## 2014-01-26 ENCOUNTER — Ambulatory Visit: Payer: Self-pay

## 2014-03-31 ENCOUNTER — Ambulatory Visit: Payer: Self-pay

## 2014-04-17 DIAGNOSIS — C50919 Malignant neoplasm of unspecified site of unspecified female breast: Secondary | ICD-10-CM

## 2014-04-17 DIAGNOSIS — C50A0 Malignant inflammatory neoplasm of unspecified breast: Secondary | ICD-10-CM

## 2014-04-17 HISTORY — PX: AXILLARY LYMPH NODE BIOPSY: SHX5737

## 2014-04-17 HISTORY — DX: Malignant inflammatory neoplasm of unspecified breast: C50.A0

## 2014-04-17 HISTORY — DX: Malignant neoplasm of unspecified site of unspecified female breast: C50.919

## 2014-04-27 ENCOUNTER — Ambulatory Visit: Payer: Self-pay | Admitting: Oncology

## 2014-04-27 LAB — HCG, QUANTITATIVE, PREGNANCY: Beta Hcg, Quant.: <1 m[IU]/mL — ABNORMAL LOW

## 2014-04-28 LAB — CANCER ANTIGEN 27.29: CA 27.29: 17.5 U/mL (ref 0.0–38.6)

## 2014-05-01 ENCOUNTER — Ambulatory Visit: Payer: Self-pay | Admitting: Oncology

## 2014-05-08 ENCOUNTER — Encounter: Payer: Self-pay | Admitting: General Surgery

## 2014-05-08 ENCOUNTER — Other Ambulatory Visit: Payer: Self-pay | Admitting: General Surgery

## 2014-05-08 ENCOUNTER — Ambulatory Visit (INDEPENDENT_AMBULATORY_CARE_PROVIDER_SITE_OTHER): Payer: 59 | Admitting: General Surgery

## 2014-05-08 VITALS — BP 140/76 | HR 76 | Resp 12 | Ht 63.0 in | Wt 150.0 lb

## 2014-05-08 DIAGNOSIS — C50912 Malignant neoplasm of unspecified site of left female breast: Secondary | ICD-10-CM

## 2014-05-08 DIAGNOSIS — C50A2 Malignant inflammatory neoplasm of left breast: Secondary | ICD-10-CM

## 2014-05-08 DIAGNOSIS — D0590 Unspecified type of carcinoma in situ of unspecified breast: Secondary | ICD-10-CM

## 2014-05-08 DIAGNOSIS — R590 Localized enlarged lymph nodes: Secondary | ICD-10-CM

## 2014-05-08 DIAGNOSIS — D0512 Intraductal carcinoma in situ of left breast: Secondary | ICD-10-CM

## 2014-05-08 DIAGNOSIS — R599 Enlarged lymph nodes, unspecified: Secondary | ICD-10-CM

## 2014-05-08 DIAGNOSIS — C50919 Malignant neoplasm of unspecified site of unspecified female breast: Secondary | ICD-10-CM | POA: Insufficient documentation

## 2014-05-08 NOTE — Progress Notes (Signed)
Patient ID: Michelle Dawson, female   DOB: [birth year], 53 y.o.   MRN: [id number]  Chief Complaint  Patient presents with  . Other    eval port placement    HPI Michelle Dawson is a 53 y.o. female here today for an evaluation port placement. Since her last visit she had been identified with erythema on the native skin status post mastectomy and latissimus dorsi flap reconstruction for DCIS. She is undergoing treatment with a course of antibiotics without improvement. Subsequent biopsy showed evidence of inflammatory carcinoma, ER and PR both positive. She is been evaluated by Michelle Dawson, M.D. with a PET/CT scan completed on 12/01/2013. The only area of increased uptake outside of the left chest wall was 8 axillary node. The patient is considered a candidate for adjuvant chemotherapy. Central venous access has been requested.                                                            HPI  Past Medical History  Diagnosis Date  . 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 The area of DCIS spanning about 3.5 cm in diameter. Margins were negative I 0.5 cm for the invasive cancer in less than 1 mm from the DCIS in multiple locations.  . Carpal tunnel syndrome 2001    nerve damage both arms  . Fibromyalgia 2001  . Sciatica 2012    Michelle Dawson referred patient to pain clinic  . Arthritis 2004  . Breast screening, unspecified   . Malignant neoplasm of upper-outer quadrant of female breast 2011    treated with left breast wide excision, mastoplasty, and sentinel node biopsy for a T1b, N0, M0; The patient Oncotype score was low at 8% with hormone treatment alone. She did not receive adjuvant chemotherapy. She has been managed with tamoxifen alone.  . Malignant neoplasm of upper-outer quadrant of female breast 09/12/2013    High-grade DCIS upper-outer quadrant of the left  breast.    Past Surgical History  Procedure Laterality Date  . Carpal tunnel release  2001  . Breast surgery Left 2011    left breast wide excision, mastoplasty, and sentinel node biopsy  . Breast biopsy Left 09/12/2013    Left breast stereotactic  . Breast reconstruction Left 10/26/13  . Reconstruction breast w/ latissimus dorsi flap Left October 20, 2013    simple mastectomy sentinel node biopsy followed by a reconstruction by Michelle Dawson    Family History  Problem Relation Age of Onset  . Melanoma Father     malignant melanoma    Social History History  Substance Use Topics  . Smoking status: Never Smoker   . Smokeless tobacco: Never Used  . Alcohol Use: No    No Known Allergies  Current Outpatient Prescriptions  Medication Sig Dispense Refill  . Ascorbic Acid (VITAMIN C WITH ROSE HIPS) 1000 MG tablet Take 1,000 mg by mouth 2 (two) times daily.      . Calcium Carbonate-Vitamin D (CALCIUM 500 + D) 500-125 MG-UNIT TABS Take by mouth daily.      . Cholecalciferol (VITAMIN D) 2000 UNITS tablet Take 2,000 Units by mouth daily.      Marland Kitchen  Coenzyme Q10 (CO Q 10 PO) Take 1 tablet by mouth daily.      . folic acid (FOLVITE) 932 MCG tablet Take 400 mcg by mouth daily.      . hydrocodone-acetaminophen (LORCET-HD) 5-500 MG per capsule Take 1 capsule by mouth 2 (two) times daily.      . IRON PO Take by mouth.      . L-Lysine 1000 MG TABS Take by mouth 2 (two) times daily.      . Multiple Vitamins-Minerals (WOMENS DAILY FORMULA PO) Take by mouth.      . vitamin B-12 (CYANOCOBALAMIN) 1000 MCG tablet Take 1,000 mcg by mouth daily.      . vitamin E 400 UNIT capsule Take 400 Units by mouth daily.      Marland Kitchen zolpidem (AMBIEN CR) 12.5 MG CR tablet Take 12.5 mg by mouth daily.       No current facility-administered medications for this visit.    Review of Systems Review of Systems  Constitutional: Negative.   Respiratory: Negative.   Cardiovascular: Negative.     Blood pressure  140/76, pulse 76, resp. rate 12, height 5\' 3"  (1.6 m), weight 150 lb (68.04 kg).  Physical Exam Physical Exam  Constitutional: She is oriented to person, place, and time. She appears well-developed and well-nourished.  Eyes: Conjunctivae are normal. No scleral icterus.  Neck: Neck supple.  Cardiovascular: Normal rate, regular rhythm and normal heart sounds.   Pulmonary/Chest: Effort normal and breath sounds normal. Right breast exhibits no inverted nipple, no mass, no nipple discharge, no skin change and no tenderness. Left breast exhibits no inverted nipple, no mass, no nipple discharge, no skin change and no tenderness.  Left well healed TRAM   right axilla there is a 1 cm node.  Abdominal: Soft. Bowel sounds are normal.  Lymphadenopathy:    She has no cervical adenopathy.  Neurological: She is alert and oriented to person, place, and time.  Skin: Skin is warm and dry.    Data Reviewed PT/CT of 05/01/2014 was reviewed. A 1.1 cm hyper metabolic lymph node is appreciated in the right axilla. On review the films this measures 1.3 x 2.4 cm and correlates with the palpable finding on today's exam. Increased uptake in the native skin (not that moved with a latissimus flap) shows faint increased uptake in the left breast.  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    Plan    The role for power port placement to allow for administration of adjuvant chemotherapy was reviewed with the patient. The risks associated with the procedure including those of bleeding, pulmonary and vascular injury were discussed.  The patient was concerned that any of the administered chemotherapy agents might be transmitted to family or her husband during intimate moments.I think this is unlikely, but this question will be forwarded to Michelle Dawson.  As the right axillary node is the only obvious site of metastatic  disease, it may be appropriate to excise this. Michelle Dawson was off today, and I will contact him tomorrow to confirm whether he would or would not like this removed at the time of power port placement. He is known that the process involving the left breast skin is ER/PR positive. He would be induced into no of the metastatic foci has similar characteristics.  The patient is amenable for node biopsy if needed.    Patient's surgery has been scheduled for  05-11-14 at [facility]   PCP: Etheleen Mayhew 05/08/2014, 8:53 PM

## 2014-05-08 NOTE — Patient Instructions (Addendum)
Patient to be scheduled for port placement.  This patient's surgery has been scheduled for 05-11-14 at [facility].

## 2014-05-09 ENCOUNTER — Telehealth: Payer: Self-pay | Admitting: General Surgery

## 2014-05-09 NOTE — Telephone Encounter (Signed)
Patient reported possible desire for referral to [facility]. Message left for husband to call to answer any questions.

## 2014-05-10 LAB — HCG, QUANTITATIVE, PREGNANCY: Beta Hcg, Quant.: <1 m[IU]/mL — ABNORMAL LOW

## 2014-05-11 ENCOUNTER — Ambulatory Visit: Payer: Self-pay | Admitting: General Surgery

## 2014-05-11 DIAGNOSIS — C50919 Malignant neoplasm of unspecified site of unspecified female breast: Secondary | ICD-10-CM

## 2014-05-11 HISTORY — PX: PORTACATH PLACEMENT: SHX2246

## 2014-05-11 LAB — CBC WITH DIFFERENTIAL/PLATELET
Basophil #: 0.1 10*3/uL (ref 0.0–0.1)
Basophil %: 1.3 %
Eosinophil #: 0.1 10*3/uL (ref 0.0–0.7)
Eosinophil %: 2.6 %
HCT: 38.5 % (ref 35.0–47.0)
HGB: 13.3 g/dL (ref 12.0–16.0)
Lymphocyte #: 1.3 10*3/uL (ref 1.0–3.6)
Lymphocyte %: 30.1 %
MCH: 31.4 pg (ref 26.0–34.0)
MCHC: 34.5 g/dL (ref 32.0–36.0)
MCV: 91 fL (ref 80–100)
MONO ABS: 0.5 x10 3/mm (ref 0.2–0.9)
MONOS PCT: 12 %
NEUTROS ABS: 2.3 10*3/uL (ref 1.4–6.5)
Neutrophil %: 54 %
PLATELETS: 214 10*3/uL (ref 150–440)
RBC: 4.23 10*6/uL (ref 3.80–5.20)
RDW: 13.8 % (ref 11.5–14.5)
WBC: 4.3 10*3/uL (ref 3.6–11.0)

## 2014-05-12 ENCOUNTER — Encounter: Payer: Self-pay | Admitting: General Surgery

## 2014-05-12 LAB — CBC CANCER CENTER
BASOS PCT: 1.1 %
Basophil #: 0.1 x10 3/mm (ref 0.0–0.1)
EOS ABS: 0.2 x10 3/mm (ref 0.0–0.7)
Eosinophil %: 4.2 %
HCT: 35.1 % (ref 35.0–47.0)
HGB: 11.8 g/dL — ABNORMAL LOW (ref 12.0–16.0)
LYMPHS PCT: 28 %
Lymphocyte #: 1.4 x10 3/mm (ref 1.0–3.6)
MCH: 31.4 pg (ref 26.0–34.0)
MCHC: 33.6 g/dL (ref 32.0–36.0)
MCV: 93 fL (ref 80–100)
MONO ABS: 0.6 x10 3/mm (ref 0.2–0.9)
MONOS PCT: 12.6 %
NEUTROS ABS: 2.7 x10 3/mm (ref 1.4–6.5)
NEUTROS PCT: 54.1 %
PLATELETS: 187 x10 3/mm (ref 150–440)
RBC: 3.76 10*6/uL — ABNORMAL LOW (ref 3.80–5.20)
RDW: 13.7 % (ref 11.5–14.5)
WBC: 5 x10 3/mm (ref 3.6–11.0)

## 2014-05-12 LAB — COMPREHENSIVE METABOLIC PANEL
ALBUMIN: 3.5 g/dL (ref 3.4–5.0)
ANION GAP: 7 (ref 7–16)
Alkaline Phosphatase: 38 U/L — ABNORMAL LOW
BILIRUBIN TOTAL: 0.3 mg/dL (ref 0.2–1.0)
BUN: 5 mg/dL — AB (ref 7–18)
CHLORIDE: 108 mmol/L — AB (ref 98–107)
CO2: 29 mmol/L (ref 21–32)
Calcium, Total: 8.2 mg/dL — ABNORMAL LOW (ref 8.5–10.1)
Creatinine: 0.78 mg/dL (ref 0.60–1.30)
EGFR (African American): >60
EGFR (Non-African Amer.): >60
Glucose: 114 mg/dL — ABNORMAL HIGH (ref 65–99)
Osmolality: 285 (ref 275–301)
POTASSIUM: 4 mmol/L (ref 3.5–5.1)
SGOT(AST): 18 U/L (ref 15–37)
SGPT (ALT): 25 U/L (ref 12–78)
Sodium: 144 mmol/L (ref 136–145)
TOTAL PROTEIN: 6.8 g/dL (ref 6.4–8.2)

## 2014-05-12 LAB — HCG, QUANTITATIVE, PREGNANCY: Beta Hcg, Quant.: <1 m[IU]/mL — ABNORMAL LOW

## 2014-05-15 ENCOUNTER — Encounter: Payer: Self-pay | Admitting: General Surgery

## 2014-05-17 ENCOUNTER — Ambulatory Visit: Payer: Self-pay | Admitting: Oncology

## 2014-05-18 ENCOUNTER — Ambulatory Visit (INDEPENDENT_AMBULATORY_CARE_PROVIDER_SITE_OTHER): Payer: Self-pay | Admitting: General Surgery

## 2014-05-18 ENCOUNTER — Encounter: Payer: Self-pay | Admitting: General Surgery

## 2014-05-18 VITALS — BP 130/74 | HR 76 | Resp 12 | Ht 63.0 in | Wt 153.0 lb

## 2014-05-18 DIAGNOSIS — D0512 Intraductal carcinoma in situ of left breast: Secondary | ICD-10-CM

## 2014-05-18 DIAGNOSIS — C50912 Malignant neoplasm of unspecified site of left female breast: Secondary | ICD-10-CM

## 2014-05-18 DIAGNOSIS — D0590 Unspecified type of carcinoma in situ of unspecified breast: Secondary | ICD-10-CM

## 2014-05-18 DIAGNOSIS — C50919 Malignant neoplasm of unspecified site of unspecified female breast: Secondary | ICD-10-CM

## 2014-05-18 DIAGNOSIS — Z170 Estrogen receptor positive status [ER+]: Secondary | ICD-10-CM

## 2014-05-18 LAB — CBC CANCER CENTER
Basophil #: 0 x10 3/mm (ref 0.0–0.1)
Basophil %: 2.1 %
EOS ABS: 0.1 x10 3/mm (ref 0.0–0.7)
EOS PCT: 6.6 %
HCT: 35.1 % (ref 35.0–47.0)
HGB: 11.7 g/dL — AB (ref 12.0–16.0)
Lymphocyte #: 0.5 x10 3/mm — ABNORMAL LOW (ref 1.0–3.6)
Lymphocyte %: 31.3 %
MCH: 31.3 pg (ref 26.0–34.0)
MCHC: 33.4 g/dL (ref 32.0–36.0)
MCV: 94 fL (ref 80–100)
Monocyte #: 0.1 x10 3/mm — ABNORMAL LOW (ref 0.2–0.9)
Monocyte %: 4.7 %
Neutrophil #: 1 x10 3/mm — ABNORMAL LOW (ref 1.4–6.5)
Neutrophil %: 55.3 %
Platelet: 98 x10 3/mm — ABNORMAL LOW (ref 150–440)
RBC: 3.74 10*6/uL — AB (ref 3.80–5.20)
RDW: 13.2 % (ref 11.5–14.5)
WBC: 1.7 x10 3/mm — CL (ref 3.6–11.0)

## 2014-05-18 NOTE — Progress Notes (Signed)
Patient ID: Michelle Dawson, female   DOB: March 12, 1961, 53 y.o.   MRN: [id number]  Chief Complaint  Patient presents with  . Routine Post Op    portacath    HPI Michelle Dawson is a 53 y.o. female here today for her post op portacath placement done on 05/11/14. Patient doing well.  HPI  Past Medical History  Diagnosis Date  . 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 The area of DCIS spanning about 3.5 cm in diameter. Margins were negative I 0.5 cm for the invasive cancer in less than 1 mm from the DCIS in multiple locations.  . Carpal tunnel syndrome 2001    nerve damage both arms  . Fibromyalgia 2001  . Sciatica 2012    Dr. [name] referred patient to pain clinic  . Arthritis 2004  . Breast screening, unspecified   . Malignant neoplasm of upper-outer quadrant of female breast 2011    treated with left breast wide excision, mastoplasty, and sentinel node biopsy for a T1b, N0, M0; The patient Oncotype score was low at 8% with hormone treatment alone. She did not receive adjuvant chemotherapy. She has been managed with tamoxifen alone.  . 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    Past Surgical History  Procedure Laterality Date  . Carpal tunnel release  2001  . Breast surgery Left 2011    left breast wide excision, mastoplasty, and sentinel node biopsy  . Breast biopsy Left 09/12/2013    Left breast stereotactic  . Breast reconstruction Left 10/26/13  . Reconstruction breast w/ latissimus dorsi flap Left October 20, 2013    simple mastectomy sentinel node biopsy followed by a reconstruction by Aaron Edelman Coan,MD  . Portacath placement  05/11/14    Family History  Problem Relation Age of Onset  . Melanoma Father     malignant melanoma    Social History History   Substance Use Topics  . Smoking status: Never Smoker   . Smokeless tobacco: Never Used  . Alcohol Use: No    No Known Allergies  Current Outpatient Prescriptions  Medication Sig Dispense Refill  . Ascorbic Acid (VITAMIN C WITH ROSE HIPS) 1000 MG tablet Take 1,000 mg by mouth 2 (two) times daily.      . Calcium Carbonate-Vitamin D (CALCIUM 500 + D) 500-125 MG-UNIT TABS Take by mouth daily.      . Cholecalciferol (VITAMIN D) 2000 UNITS tablet Take 2,000 Units by mouth daily.      . Coenzyme Q10 (CO Q 10 PO) Take 1 tablet by mouth daily.      . folic acid (FOLVITE) 503 MCG tablet Take 400 mcg by mouth daily.      . hydrocodone-acetaminophen (LORCET-HD) 5-500 MG per capsule Take 1 capsule by mouth 2 (two) times daily.      . IRON PO Take by mouth.      . L-Lysine 1000 MG TABS Take by mouth 2 (two) times daily.      . Multiple Vitamins-Minerals (WOMENS DAILY FORMULA PO) Take by mouth.      . vitamin B-12 (CYANOCOBALAMIN) 1000 MCG tablet Take 1,000 mcg by mouth daily.      . vitamin E 400 UNIT capsule Take 400 Units by mouth daily.      Marland Kitchen zolpidem (AMBIEN  CR) 12.5 MG CR tablet Take 12.5 mg by mouth daily.       No current facility-administered medications for this visit.    Review of Systems Review of Systems  Constitutional: Negative.   Respiratory: Negative.   Cardiovascular: Negative.     Blood pressure 130/74, pulse 76, resp. rate 12, height 5' 3"  (1.6 m), weight 153 lb (69.4 kg).  Physical Exam Physical Exam  Constitutional: She is oriented to person, place, and time. She appears well-developed and well-nourished.  Pulmonary/Chest:  Port a cath site looks clean and healing well.   Neurological: She is alert and oriented to person, place, and time.  Skin: Skin is warm.  Right axillary incision is healing well. No evidence of seroma formation.   Data Reviewed 3 nodes were removed from the right axilla, the largest was found to have metastatic carcinoma consistent with a  breast primary. HER-2/neu testing pending.  Assessment    Inflammatory carcinoma left breast.     Plan    The patient is doing exceptionally well. She tolerated her first chemotherapy. Will plan on a repeat examination in 3 months to see the response to the skin changes on the left chest wall.        Robert Bellow 05/18/2014, 9:51 PM

## 2014-05-18 NOTE — Patient Instructions (Addendum)
The patient is aware to use a heating pad as needed for comfort. Patient to return in three months.

## 2014-05-25 LAB — PATHOLOGY REPORT

## 2014-05-26 ENCOUNTER — Encounter: Payer: Self-pay | Admitting: General Surgery

## 2014-05-26 LAB — CBC CANCER CENTER
Basophil #: 0 x10 3/mm (ref 0.0–0.1)
Basophil %: 0.6 %
Eosinophil #: 0 x10 3/mm (ref 0.0–0.7)
Eosinophil %: 0.5 %
HCT: 37.6 % (ref 35.0–47.0)
HGB: 12.6 g/dL (ref 12.0–16.0)
Lymphocyte #: 0.9 x10 3/mm — ABNORMAL LOW (ref 1.0–3.6)
Lymphocyte %: 14.1 %
MCH: 31.1 pg (ref 26.0–34.0)
MCHC: 33.5 g/dL (ref 32.0–36.0)
MCV: 93 fL (ref 80–100)
Monocyte #: 0.8 x10 3/mm (ref 0.2–0.9)
Monocyte %: 12.1 %
Neutrophil #: 4.8 x10 3/mm (ref 1.4–6.5)
Neutrophil %: 72.7 %
Platelet: 211 x10 3/mm (ref 150–440)
RBC: 4.06 10*6/uL (ref 3.80–5.20)
RDW: 13.5 % (ref 11.5–14.5)
WBC: 6.6 x10 3/mm (ref 3.6–11.0)

## 2014-06-02 LAB — CBC CANCER CENTER
Basophil #: 0 x10 3/mm (ref 0.0–0.1)
Basophil %: 1 %
Eosinophil #: 0 x10 3/mm (ref 0.0–0.7)
Eosinophil %: 0.6 %
HCT: 37 % (ref 35.0–47.0)
HGB: 12.5 g/dL (ref 12.0–16.0)
Lymphocyte #: 0.9 x10 3/mm — ABNORMAL LOW (ref 1.0–3.6)
Lymphocyte %: 17.4 %
MCH: 31.4 pg (ref 26.0–34.0)
MCHC: 33.8 g/dL (ref 32.0–36.0)
MCV: 93 fL (ref 80–100)
MONO ABS: 0.9 x10 3/mm (ref 0.2–0.9)
MONOS PCT: 17.2 %
NEUTROS PCT: 63.8 %
Neutrophil #: 3.2 x10 3/mm (ref 1.4–6.5)
Platelet: 348 x10 3/mm (ref 150–440)
RBC: 3.98 10*6/uL (ref 3.80–5.20)
RDW: 13.6 % (ref 11.5–14.5)
WBC: 5 x10 3/mm (ref 3.6–11.0)

## 2014-06-02 LAB — COMPREHENSIVE METABOLIC PANEL
ALBUMIN: 3.7 g/dL (ref 3.4–5.0)
ALT: 29 U/L (ref 12–78)
ANION GAP: 7 (ref 7–16)
Alkaline Phosphatase: 52 U/L
BILIRUBIN TOTAL: 0.3 mg/dL (ref 0.2–1.0)
BUN: 11 mg/dL (ref 7–18)
CALCIUM: 9.1 mg/dL (ref 8.5–10.1)
CO2: 31 mmol/L (ref 21–32)
Chloride: 105 mmol/L (ref 98–107)
Creatinine: 0.67 mg/dL (ref 0.60–1.30)
EGFR (Non-African Amer.): >60
Glucose: 90 mg/dL (ref 65–99)
Osmolality: 284 (ref 275–301)
Potassium: 3.8 mmol/L (ref 3.5–5.1)
SGOT(AST): 23 U/L (ref 15–37)
SODIUM: 143 mmol/L (ref 136–145)
Total Protein: 7.2 g/dL (ref 6.4–8.2)

## 2014-06-09 LAB — CBC CANCER CENTER
BASOS PCT: 3.3 %
Basophil #: 0.2 x10 3/mm — ABNORMAL HIGH (ref 0.0–0.1)
Eosinophil #: 0.1 x10 3/mm (ref 0.0–0.7)
Eosinophil %: 1.6 %
HCT: 36.2 % (ref 35.0–47.0)
HGB: 11.8 g/dL — ABNORMAL LOW (ref 12.0–16.0)
LYMPHS ABS: 0.7 x10 3/mm — AB (ref 1.0–3.6)
LYMPHS PCT: 12.1 %
MCH: 30.7 pg (ref 26.0–34.0)
MCHC: 32.6 g/dL (ref 32.0–36.0)
MCV: 94 fL (ref 80–100)
MONO ABS: 0.4 x10 3/mm (ref 0.2–0.9)
Monocyte %: 6.7 %
Neutrophil #: 4.2 x10 3/mm (ref 1.4–6.5)
Neutrophil %: 76.3 %
PLATELETS: 196 x10 3/mm (ref 150–440)
RBC: 3.85 10*6/uL (ref 3.80–5.20)
RDW: 13.7 % (ref 11.5–14.5)
WBC: 5.6 x10 3/mm (ref 3.6–11.0)

## 2014-06-15 ENCOUNTER — Ambulatory Visit: Payer: Self-pay | Admitting: General Surgery

## 2014-06-16 LAB — CBC CANCER CENTER
BASOS PCT: 0.7 %
Basophil #: 0.1 x10 3/mm (ref 0.0–0.1)
EOS ABS: 0.1 x10 3/mm (ref 0.0–0.7)
Eosinophil %: 1.4 %
HCT: 36.4 % (ref 35.0–47.0)
HGB: 12.2 g/dL (ref 12.0–16.0)
Lymphocyte #: 0.9 x10 3/mm — ABNORMAL LOW (ref 1.0–3.6)
Lymphocyte %: 11 %
MCH: 31.4 pg (ref 26.0–34.0)
MCHC: 33.4 g/dL (ref 32.0–36.0)
MCV: 94 fL (ref 80–100)
MONOS PCT: 12.7 %
Monocyte #: 1 x10 3/mm — ABNORMAL HIGH (ref 0.2–0.9)
NEUTROS ABS: 5.8 x10 3/mm (ref 1.4–6.5)
NEUTROS PCT: 74.2 %
PLATELETS: 145 x10 3/mm — AB (ref 150–440)
RBC: 3.87 10*6/uL (ref 3.80–5.20)
RDW: 14.1 % (ref 11.5–14.5)
WBC: 7.8 x10 3/mm (ref 3.6–11.0)

## 2014-06-17 ENCOUNTER — Ambulatory Visit: Payer: Self-pay | Admitting: Oncology

## 2014-06-23 LAB — COMPREHENSIVE METABOLIC PANEL
ALBUMIN: 3.6 g/dL (ref 3.4–5.0)
ALT: 54 U/L
Alkaline Phosphatase: 59 U/L
Anion Gap: 7 (ref 7–16)
BUN: 9 mg/dL (ref 7–18)
Bilirubin,Total: 0.4 mg/dL (ref 0.2–1.0)
CO2: 30 mmol/L (ref 21–32)
CREATININE: 0.84 mg/dL (ref 0.60–1.30)
Calcium, Total: 8.6 mg/dL (ref 8.5–10.1)
Chloride: 105 mmol/L (ref 98–107)
Glucose: 103 mg/dL — ABNORMAL HIGH (ref 65–99)
Osmolality: 282 (ref 275–301)
Potassium: 3.8 mmol/L (ref 3.5–5.1)
SGOT(AST): 31 U/L (ref 15–37)
Sodium: 142 mmol/L (ref 136–145)
TOTAL PROTEIN: 6.9 g/dL (ref 6.4–8.2)

## 2014-06-23 LAB — CBC CANCER CENTER
BASOS ABS: 0.1 x10 3/mm (ref 0.0–0.1)
Basophil %: 1.3 %
EOS ABS: 0.1 x10 3/mm (ref 0.0–0.7)
EOS PCT: 1.2 %
HCT: 35.7 % (ref 35.0–47.0)
HGB: 11.7 g/dL — AB (ref 12.0–16.0)
LYMPHS PCT: 16.1 %
Lymphocyte #: 0.8 x10 3/mm — ABNORMAL LOW (ref 1.0–3.6)
MCH: 31.2 pg (ref 26.0–34.0)
MCHC: 32.9 g/dL (ref 32.0–36.0)
MCV: 95 fL (ref 80–100)
Monocyte #: 0.9 x10 3/mm (ref 0.2–0.9)
Monocyte %: 17.8 %
NEUTROS ABS: 3.3 x10 3/mm (ref 1.4–6.5)
Neutrophil %: 63.6 %
Platelet: 271 x10 3/mm (ref 150–440)
RBC: 3.76 10*6/uL — ABNORMAL LOW (ref 3.80–5.20)
RDW: 14.7 % — AB (ref 11.5–14.5)
WBC: 5.2 x10 3/mm (ref 3.6–11.0)

## 2014-06-30 LAB — CBC CANCER CENTER
BASOS PCT: 0.8 %
Basophil #: 0 x10 3/mm (ref 0.0–0.1)
EOS ABS: 0.1 x10 3/mm (ref 0.0–0.7)
EOS PCT: 1.5 %
HCT: 32.4 % — AB (ref 35.0–47.0)
HGB: 10.9 g/dL — ABNORMAL LOW (ref 12.0–16.0)
LYMPHS ABS: 0.5 x10 3/mm — AB (ref 1.0–3.6)
LYMPHS PCT: 8.4 %
MCH: 32.1 pg (ref 26.0–34.0)
MCHC: 33.6 g/dL (ref 32.0–36.0)
MCV: 96 fL (ref 80–100)
Monocyte #: 0.3 x10 3/mm (ref 0.2–0.9)
Monocyte %: 4.9 %
NEUTROS PCT: 84.4 %
Neutrophil #: 5 x10 3/mm (ref 1.4–6.5)
Platelet: 139 x10 3/mm — ABNORMAL LOW (ref 150–440)
RBC: 3.39 10*6/uL — ABNORMAL LOW (ref 3.80–5.20)
RDW: 14.8 % — AB (ref 11.5–14.5)
WBC: 5.9 x10 3/mm (ref 3.6–11.0)

## 2014-07-07 LAB — CBC CANCER CENTER
BASOS ABS: 0.1 x10 3/mm (ref 0.0–0.1)
Basophil %: 1 %
Eosinophil #: 0.1 x10 3/mm (ref 0.0–0.7)
Eosinophil %: 1.2 %
HCT: 36.3 % (ref 35.0–47.0)
HGB: 11.9 g/dL — AB (ref 12.0–16.0)
LYMPHS ABS: 0.8 x10 3/mm — AB (ref 1.0–3.6)
Lymphocyte %: 10.2 %
MCH: 31.6 pg (ref 26.0–34.0)
MCHC: 32.7 g/dL (ref 32.0–36.0)
MCV: 97 fL (ref 80–100)
MONO ABS: 1 x10 3/mm — AB (ref 0.2–0.9)
Monocyte %: 13.2 %
NEUTROS PCT: 74.4 %
Neutrophil #: 5.8 x10 3/mm (ref 1.4–6.5)
PLATELETS: 218 x10 3/mm (ref 150–440)
RBC: 3.76 10*6/uL — ABNORMAL LOW (ref 3.80–5.20)
RDW: 15.4 % — ABNORMAL HIGH (ref 11.5–14.5)
WBC: 7.8 x10 3/mm (ref 3.6–11.0)

## 2014-07-14 LAB — CBC CANCER CENTER
BASOS ABS: 0.3 x10 3/mm — AB (ref 0.0–0.1)
Basophil %: 4.3 %
EOS PCT: 1.4 %
Eosinophil #: 0.1 x10 3/mm (ref 0.0–0.7)
HCT: 37 % (ref 35.0–47.0)
HGB: 12.3 g/dL (ref 12.0–16.0)
Lymphocyte #: 0.7 x10 3/mm — ABNORMAL LOW (ref 1.0–3.6)
Lymphocyte %: 12.6 %
MCH: 31.8 pg (ref 26.0–34.0)
MCHC: 33.3 g/dL (ref 32.0–36.0)
MCV: 96 fL (ref 80–100)
Monocyte #: 0.8 x10 3/mm (ref 0.2–0.9)
Monocyte %: 14.2 %
NEUTROS ABS: 4 x10 3/mm (ref 1.4–6.5)
Neutrophil %: 67.5 %
PLATELETS: 302 x10 3/mm (ref 150–440)
RBC: 3.87 10*6/uL (ref 3.80–5.20)
RDW: 15.6 % — ABNORMAL HIGH (ref 11.5–14.5)
WBC: 5.9 x10 3/mm (ref 3.6–11.0)

## 2014-07-14 LAB — COMPREHENSIVE METABOLIC PANEL
ALBUMIN: 3.8 g/dL (ref 3.4–5.0)
ALK PHOS: 65 U/L
Anion Gap: 11 (ref 7–16)
BILIRUBIN TOTAL: 0.3 mg/dL (ref 0.2–1.0)
BUN: 12 mg/dL (ref 7–18)
CALCIUM: 8.7 mg/dL (ref 8.5–10.1)
CHLORIDE: 104 mmol/L (ref 98–107)
CO2: 26 mmol/L (ref 21–32)
CREATININE: 0.72 mg/dL (ref 0.60–1.30)
EGFR (African American): >60
EGFR (Non-African Amer.): >60
Glucose: 100 mg/dL — ABNORMAL HIGH (ref 65–99)
OSMOLALITY: 281 (ref 275–301)
Potassium: 4.1 mmol/L (ref 3.5–5.1)
SGOT(AST): 28 U/L (ref 15–37)
SGPT (ALT): 50 U/L
Sodium: 141 mmol/L (ref 136–145)
TOTAL PROTEIN: 7.3 g/dL (ref 6.4–8.2)

## 2014-07-18 ENCOUNTER — Ambulatory Visit: Payer: Self-pay | Admitting: Oncology

## 2014-08-04 LAB — CBC CANCER CENTER
BASOS ABS: 0.2 x10 3/mm — AB (ref 0.0–0.1)
Basophil %: 3.1 %
EOS ABS: 0.1 x10 3/mm (ref 0.0–0.7)
Eosinophil %: 2.4 %
HCT: 35.9 % (ref 35.0–47.0)
HGB: 12.1 g/dL (ref 12.0–16.0)
LYMPHS ABS: 0.6 x10 3/mm — AB (ref 1.0–3.6)
Lymphocyte %: 11.3 %
MCH: 32 pg (ref 26.0–34.0)
MCHC: 33.8 g/dL (ref 32.0–36.0)
MCV: 95 fL (ref 80–100)
MONO ABS: 1.2 x10 3/mm — AB (ref 0.2–0.9)
Monocyte %: 22.8 %
Neutrophil #: 3.1 x10 3/mm (ref 1.4–6.5)
Neutrophil %: 60.4 %
Platelet: 261 x10 3/mm (ref 150–440)
RBC: 3.79 10*6/uL — AB (ref 3.80–5.20)
RDW: 15.3 % — ABNORMAL HIGH (ref 11.5–14.5)
WBC: 5.2 x10 3/mm (ref 3.6–11.0)

## 2014-08-04 LAB — COMPREHENSIVE METABOLIC PANEL
ALK PHOS: 67 U/L
ALT: 64 U/L — AB
AST: 36 U/L (ref 15–37)
Albumin: 3.7 g/dL (ref 3.4–5.0)
Anion Gap: 9 (ref 7–16)
BILIRUBIN TOTAL: 0.4 mg/dL (ref 0.2–1.0)
BUN: 10 mg/dL (ref 7–18)
CHLORIDE: 101 mmol/L (ref 98–107)
Calcium, Total: 9.1 mg/dL (ref 8.5–10.1)
Co2: 29 mmol/L (ref 21–32)
Creatinine: 0.79 mg/dL (ref 0.60–1.30)
EGFR (Non-African Amer.): >60
GLUCOSE: 104 mg/dL — AB (ref 65–99)
Osmolality: 277 (ref 275–301)
POTASSIUM: 4 mmol/L (ref 3.5–5.1)
SODIUM: 139 mmol/L (ref 136–145)
Total Protein: 7.1 g/dL (ref 6.4–8.2)

## 2014-08-11 LAB — CBC CANCER CENTER
Basophil #: 0 x10 3/mm (ref 0.0–0.1)
Basophil %: 0.9 %
EOS PCT: 2.1 %
Eosinophil #: 0 x10 3/mm (ref 0.0–0.7)
HCT: 33.9 % — ABNORMAL LOW (ref 35.0–47.0)
HGB: 11.4 g/dL — AB (ref 12.0–16.0)
LYMPHS ABS: 0.4 x10 3/mm — AB (ref 1.0–3.6)
Lymphocyte %: 25.1 %
MCH: 31.6 pg (ref 26.0–34.0)
MCHC: 33.5 g/dL (ref 32.0–36.0)
MCV: 95 fL (ref 80–100)
Monocyte #: 0.1 x10 3/mm — ABNORMAL LOW (ref 0.2–0.9)
Monocyte %: 4.6 %
Neutrophil #: 1 x10 3/mm — ABNORMAL LOW (ref 1.4–6.5)
Neutrophil %: 67.3 %
Platelet: 175 x10 3/mm (ref 150–440)
RBC: 3.59 10*6/uL — ABNORMAL LOW (ref 3.80–5.20)
RDW: 15.1 % — AB (ref 11.5–14.5)
WBC: 1.5 x10 3/mm — AB (ref 3.6–11.0)

## 2014-08-15 LAB — CBC CANCER CENTER
BANDS NEUTROPHIL: 29 %
Eosinophil: 1 %
HCT: 35.4 % (ref 35.0–47.0)
HGB: 11.6 g/dL — AB (ref 12.0–16.0)
Lymphocytes: 5 %
MCH: 30.9 pg (ref 26.0–34.0)
MCHC: 32.6 g/dL (ref 32.0–36.0)
MCV: 95 fL (ref 80–100)
METAMYELOCYTE: 12 %
MYELOCYTE: 11 %
Monocytes: 12 %
NRBC/100 WBC: 2 /100
Platelet: 211 x10 3/mm (ref 150–440)
Promyelocyte: 6 %
RBC: 3.73 10*6/uL — ABNORMAL LOW (ref 3.80–5.20)
RDW: 14.7 % — ABNORMAL HIGH (ref 11.5–14.5)
SEGMENTED NEUTROPHILS: 24 %
WBC: 45.5 x10 3/mm — ABNORMAL HIGH (ref 3.6–11.0)

## 2014-08-15 LAB — URINALYSIS, COMPLETE
BLOOD: NEGATIVE
Bacteria: NONE SEEN
Bilirubin,UR: NEGATIVE
GLUCOSE, UR: NEGATIVE mg/dL (ref 0–75)
Ketone: NEGATIVE
Leukocyte Esterase: NEGATIVE
NITRITE: NEGATIVE
PROTEIN: NEGATIVE
Ph: 7 (ref 4.5–8.0)
RBC,UR: 1 /HPF (ref 0–5)
Specific Gravity: 1.001 (ref 1.003–1.030)
Squamous Epithelial: 7

## 2014-08-17 ENCOUNTER — Ambulatory Visit: Payer: Self-pay | Admitting: Oncology

## 2014-08-17 LAB — URINE CULTURE

## 2014-08-20 LAB — CULTURE, BLOOD (SINGLE)

## 2014-08-24 ENCOUNTER — Ambulatory Visit: Payer: 59 | Admitting: General Surgery

## 2014-08-25 LAB — CBC CANCER CENTER
BASOS PCT: 1.1 %
Basophil #: 0.1 x10 3/mm (ref 0.0–0.1)
EOS ABS: 0 x10 3/mm (ref 0.0–0.7)
EOS PCT: 0.4 %
HCT: 32.7 % — ABNORMAL LOW (ref 35.0–47.0)
HGB: 11 g/dL — ABNORMAL LOW (ref 12.0–16.0)
Lymphocyte #: 0.8 x10 3/mm — ABNORMAL LOW (ref 1.0–3.6)
Lymphocyte %: 8.6 %
MCH: 31.3 pg (ref 26.0–34.0)
MCHC: 33.6 g/dL (ref 32.0–36.0)
MCV: 93 fL (ref 80–100)
MONO ABS: 0.9 x10 3/mm (ref 0.2–0.9)
Monocyte %: 9.4 %
Neutrophil #: 7.7 x10 3/mm — ABNORMAL HIGH (ref 1.4–6.5)
Neutrophil %: 80.5 %
Platelet: 194 x10 3/mm (ref 150–440)
RBC: 3.51 10*6/uL — ABNORMAL LOW (ref 3.80–5.20)
RDW: 14.9 % — ABNORMAL HIGH (ref 11.5–14.5)
WBC: 9.5 x10 3/mm (ref 3.6–11.0)

## 2014-08-25 LAB — COMPREHENSIVE METABOLIC PANEL
ALK PHOS: 88 U/L
ALT: 54 U/L
Albumin: 3.6 g/dL (ref 3.4–5.0)
Anion Gap: 8 (ref 7–16)
BILIRUBIN TOTAL: 0.5 mg/dL (ref 0.2–1.0)
BUN: 12 mg/dL (ref 7–18)
CHLORIDE: 102 mmol/L (ref 98–107)
Calcium, Total: 9.1 mg/dL (ref 8.5–10.1)
Co2: 28 mmol/L (ref 21–32)
Creatinine: 0.73 mg/dL (ref 0.60–1.30)
EGFR (Non-African Amer.): >60
Glucose: 99 mg/dL (ref 65–99)
Osmolality: 275 (ref 275–301)
POTASSIUM: 3.7 mmol/L (ref 3.5–5.1)
SGOT(AST): 30 U/L (ref 15–37)
Sodium: 138 mmol/L (ref 136–145)
TOTAL PROTEIN: 6.7 g/dL (ref 6.4–8.2)

## 2014-09-01 LAB — CBC CANCER CENTER
Basophil #: 0.1 x10 3/mm (ref 0.0–0.1)
Basophil %: 1.3 %
EOS ABS: 0.2 x10 3/mm (ref 0.0–0.7)
Eosinophil %: 2.1 %
HCT: 29.1 % — ABNORMAL LOW (ref 35.0–47.0)
HGB: 9.8 g/dL — ABNORMAL LOW (ref 12.0–16.0)
Lymphocyte #: 0.7 x10 3/mm — ABNORMAL LOW (ref 1.0–3.6)
Lymphocyte %: 8.3 %
MCH: 31.7 pg (ref 26.0–34.0)
MCHC: 33.7 g/dL (ref 32.0–36.0)
MCV: 94 fL (ref 80–100)
MONO ABS: 1.5 x10 3/mm — AB (ref 0.2–0.9)
Monocyte %: 16.5 %
NEUTROS PCT: 71.8 %
Neutrophil #: 6.3 x10 3/mm (ref 1.4–6.5)
Platelet: 102 x10 3/mm — ABNORMAL LOW (ref 150–440)
RBC: 3.09 10*6/uL — ABNORMAL LOW (ref 3.80–5.20)
RDW: 15.1 % — ABNORMAL HIGH (ref 11.5–14.5)
WBC: 8.8 x10 3/mm (ref 3.6–11.0)

## 2014-09-15 LAB — CBC CANCER CENTER
Basophil #: 0.1 x10 3/mm (ref 0.0–0.1)
Basophil %: 1.3 %
Eosinophil #: 0 x10 3/mm (ref 0.0–0.7)
Eosinophil %: 0.7 %
HCT: 32.1 % — ABNORMAL LOW (ref 35.0–47.0)
HGB: 10.6 g/dL — AB (ref 12.0–16.0)
LYMPHS PCT: 12.1 %
Lymphocyte #: 0.6 x10 3/mm — ABNORMAL LOW (ref 1.0–3.6)
MCH: 32 pg (ref 26.0–34.0)
MCHC: 33.1 g/dL (ref 32.0–36.0)
MCV: 97 fL (ref 80–100)
Monocyte #: 0.7 x10 3/mm (ref 0.2–0.9)
Monocyte %: 16 %
Neutrophil #: 3.3 x10 3/mm (ref 1.4–6.5)
Neutrophil %: 69.9 %
Platelet: 245 x10 3/mm (ref 150–440)
RBC: 3.32 10*6/uL — ABNORMAL LOW (ref 3.80–5.20)
RDW: 15.9 % — ABNORMAL HIGH (ref 11.5–14.5)
WBC: 4.7 x10 3/mm (ref 3.6–11.0)

## 2014-09-15 LAB — COMPREHENSIVE METABOLIC PANEL
Albumin: 3.7 g/dL (ref 3.4–5.0)
Alkaline Phosphatase: 63 U/L
Anion Gap: 9 (ref 7–16)
BUN: 10 mg/dL (ref 7–18)
Bilirubin,Total: 0.5 mg/dL (ref 0.2–1.0)
Calcium, Total: 8.8 mg/dL (ref 8.5–10.1)
Chloride: 108 mmol/L — ABNORMAL HIGH (ref 98–107)
Co2: 28 mmol/L (ref 21–32)
Creatinine: 0.65 mg/dL (ref 0.60–1.30)
EGFR (Non-African Amer.): >60
Glucose: 95 mg/dL (ref 65–99)
Osmolality: 288 (ref 275–301)
POTASSIUM: 3.8 mmol/L (ref 3.5–5.1)
SGOT(AST): 19 U/L (ref 15–37)
SGPT (ALT): 28 U/L
Sodium: 145 mmol/L (ref 136–145)
TOTAL PROTEIN: 6.9 g/dL (ref 6.4–8.2)

## 2014-09-17 ENCOUNTER — Ambulatory Visit: Payer: Self-pay | Admitting: Oncology

## 2014-09-18 ENCOUNTER — Encounter: Payer: Self-pay | Admitting: General Surgery

## 2014-10-05 ENCOUNTER — Encounter: Payer: Self-pay | Admitting: *Deleted

## 2014-10-06 LAB — CBC CANCER CENTER
BASOS PCT: 1.3 %
Basophil #: 0.1 x10 3/mm (ref 0.0–0.1)
Eosinophil #: 0.1 x10 3/mm (ref 0.0–0.7)
Eosinophil %: 0.9 %
HCT: 33.2 % — ABNORMAL LOW (ref 35.0–47.0)
HGB: 10.7 g/dL — AB (ref 12.0–16.0)
LYMPHS ABS: 0.5 x10 3/mm — AB (ref 1.0–3.6)
Lymphocyte %: 8.8 %
MCH: 31.6 pg (ref 26.0–34.0)
MCHC: 32.3 g/dL (ref 32.0–36.0)
MCV: 98 fL (ref 80–100)
MONOS PCT: 16.4 %
Monocyte #: 1 x10 3/mm — ABNORMAL HIGH (ref 0.2–0.9)
NEUTROS PCT: 72.6 %
Neutrophil #: 4.4 x10 3/mm (ref 1.4–6.5)
Platelet: 245 x10 3/mm (ref 150–440)
RBC: 3.39 10*6/uL — AB (ref 3.80–5.20)
RDW: 16.8 % — ABNORMAL HIGH (ref 11.5–14.5)
WBC: 6.1 x10 3/mm (ref 3.6–11.0)

## 2014-10-06 LAB — COMPREHENSIVE METABOLIC PANEL
Albumin: 3.7 g/dL (ref 3.4–5.0)
Alkaline Phosphatase: 60 U/L
Anion Gap: 10 (ref 7–16)
BUN: 10 mg/dL (ref 7–18)
Bilirubin,Total: 0.5 mg/dL (ref 0.2–1.0)
CHLORIDE: 104 mmol/L (ref 98–107)
CO2: 29 mmol/L (ref 21–32)
Calcium, Total: 8.9 mg/dL (ref 8.5–10.1)
Creatinine: 0.69 mg/dL (ref 0.60–1.30)
EGFR (African American): >60
GLUCOSE: 94 mg/dL (ref 65–99)
OSMOLALITY: 284 (ref 275–301)
POTASSIUM: 3.8 mmol/L (ref 3.5–5.1)
SGOT(AST): 17 U/L (ref 15–37)
SGPT (ALT): 25 U/L
Sodium: 143 mmol/L (ref 136–145)
Total Protein: 6.7 g/dL (ref 6.4–8.2)

## 2014-10-17 ENCOUNTER — Ambulatory Visit: Payer: Self-pay | Admitting: Oncology

## 2014-10-17 HISTORY — PX: PORT-A-CATH REMOVAL: SHX5289

## 2014-10-25 ENCOUNTER — Encounter: Payer: Self-pay | Admitting: General Surgery

## 2014-10-25 ENCOUNTER — Ambulatory Visit (INDEPENDENT_AMBULATORY_CARE_PROVIDER_SITE_OTHER): Payer: 59 | Admitting: General Surgery

## 2014-10-25 VITALS — BP 130/74 | HR 74 | Resp 12 | Ht 63.0 in | Wt 148.0 lb

## 2014-10-25 DIAGNOSIS — C50912 Malignant neoplasm of unspecified site of left female breast: Secondary | ICD-10-CM

## 2014-10-25 NOTE — Progress Notes (Signed)
Patient ID: Michelle Dawson, female   DOB: Jul 22, 1961, 53 y.o.   MRN: [id number]  Chief Complaint  Patient presents with  . Procedure    port removal    HPI Michelle Dawson is a 53 y.o. female who presents for a port removal. The patient has completed adjuvant therapy for her inflammatory carcinoma. She reports that the port has been painful since placement, interfering with sleep and all activities. She reports that she will not take any additional chemotherapy, and for that reason she requested that the port be removed.   Prior to her presentation I did speak with her medical oncologist confirming his reluctant approval for port removal. HPI  Past Medical History  Diagnosis Date  . 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 The area of DCIS spanning about 3.5 cm in diameter. Margins were negative I 0.5 cm for the invasive cancer in less than 1 mm from the DCIS in multiple locations.  . Carpal tunnel syndrome 2001    nerve damage both arms  . Fibromyalgia 2001  . Sciatica 2012    Dr. [name] referred patient to pain clinic  . Arthritis 2004  . Breast screening, unspecified   . Malignant neoplasm of upper-outer quadrant of female breast 2011    treated with left breast wide excision, mastoplasty, and sentinel node biopsy for a T1b, N0, M0; The patient Oncotype score was low at 8% with hormone treatment alone. She did not receive adjuvant chemotherapy. She has been managed with tamoxifen alone.  . 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    Past Surgical History  Procedure Laterality Date  . Carpal tunnel release  2001  . Breast surgery Left 2011    left breast wide excision, mastoplasty, and sentinel node biopsy  . Breast biopsy Left 09/12/2013    Left breast  stereotactic  . Breast reconstruction Left 10/26/13  . Reconstruction breast w/ latissimus dorsi flap Left October 20, 2013    simple mastectomy sentinel node biopsy followed by a reconstruction by Aaron Edelman Coan,MD  . Portacath placement  05/11/14    Family History  Problem Relation Age of Onset  . Melanoma Father     malignant melanoma    Social History History  Substance Use Topics  . Smoking status: Never Smoker   . Smokeless tobacco: Never Used  . Alcohol Use: No    No Known Allergies  Current Outpatient Prescriptions  Medication Sig Dispense Refill  . Ascorbic Acid (VITAMIN C WITH ROSE HIPS) 1000 MG tablet Take 1,000 mg by mouth 2 (two) times daily.    . Calcium Carbonate-Vitamin D (CALCIUM 500 + D) 500-125 MG-UNIT TABS Take by mouth daily.    . Cholecalciferol (VITAMIN D) 2000 UNITS tablet Take 2,000 Units by mouth daily.    . Coenzyme Q10 (CO Q 10 PO) Take 1 tablet by mouth daily.    . folic acid (FOLVITE) 903 MCG tablet Take 400 mcg by mouth daily.    . hydrocodone-acetaminophen (LORCET-HD) 5-500 MG per capsule Take 1 capsule by mouth 2 (two) times daily.    . IRON PO Take by mouth.    . L-Lysine 1000 MG TABS Take by mouth 2 (two) times daily.    . Multiple Vitamins-Minerals (WOMENS DAILY FORMULA PO) Take by mouth.    Marland Kitchen  Probiotic Product (PROBIOTIC DAILY PO) Take by mouth.    . vitamin B-12 (CYANOCOBALAMIN) 1000 MCG tablet Take 1,000 mcg by mouth daily.    . vitamin E 400 UNIT capsule Take 400 Units by mouth daily.    Marland Kitchen zolpidem (AMBIEN CR) 12.5 MG CR tablet Take 12.5 mg by mouth daily.     No current facility-administered medications for this visit.    Review of Systems Review of Systems  Constitutional: Negative.   Respiratory: Negative.   Cardiovascular: Negative.     Blood pressure 130/74, pulse 74, resp. rate 12, height 5\' 3"  (1.6 m), weight 148 lb (67.132 kg).  Physical Exam Physical Exam Examination of the left breast shows resolution of all the  previously noted skin changes. She is still troubled by the "ear" at the base of the latissimus flap harvest site. This is less than a centimeter in height and perhaps 3 cm in length.  Examination of the right upper inner quadrant port site shows no inflammation or induration. She reported pain in the axilla and she does show a prominent tendon going across the superior aspect of the axilla.     Assessment    Inflammatory carcinoma of the left breast, completed chemotherapy.    Plan    Port removal was discussed. The area was prepped with alcohol and 10 mL of 0.5% Xylocaine with 0.25% Marcaine with 1-200,000 units of epinephrine was utilized well tolerated. ChloraPrep was applied to the skin. The original incision was opened and the port freed from adjacent tissues without difficulty. The catheter was extracted intact. No bleeding was noted. The wound was closed in layers with 3-0 Vicryl running suture to the adipose layer and a running 3-0 Vicryl septic suture for the skin. Benzoin and Steri-Strips followed by Telfa and Tegaderm dressings were applied.  The patient was instructed that she may resume regular activities in 3 days including running, her favorite past time. She may shower any time.    PCP:  Margarita Rana Ref. MD: Dr. Havery Moros, [place] Gleason 10/25/2014, 6:04 PM

## 2014-10-25 NOTE — Patient Instructions (Signed)
The patient is aware to call back for any questions or concerns.  

## 2014-11-01 ENCOUNTER — Ambulatory Visit (INDEPENDENT_AMBULATORY_CARE_PROVIDER_SITE_OTHER): Payer: Self-pay | Admitting: *Deleted

## 2014-11-01 DIAGNOSIS — D0512 Intraductal carcinoma in situ of left breast: Secondary | ICD-10-CM

## 2014-11-01 NOTE — Progress Notes (Signed)
Patient came in today for a wound check.  The wound is clean, with no signs of infection noted. .Follow up as scheduled.  

## 2014-11-01 NOTE — Patient Instructions (Signed)
The patient is aware to call back for any questions or concerns.  

## 2014-12-07 ENCOUNTER — Ambulatory Visit: Payer: Self-pay | Admitting: Oncology

## 2014-12-07 LAB — CBC CANCER CENTER
BASOS ABS: 0 x10 3/mm (ref 0.0–0.1)
Basophil %: 1.2 %
EOS ABS: 0.2 x10 3/mm (ref 0.0–0.7)
EOS PCT: 6.8 %
HCT: 37.3 % (ref 35.0–47.0)
HGB: 12.5 g/dL (ref 12.0–16.0)
Lymphocyte #: 0.8 x10 3/mm — ABNORMAL LOW (ref 1.0–3.6)
Lymphocyte %: 25 %
MCH: 31.4 pg (ref 26.0–34.0)
MCHC: 33.4 g/dL (ref 32.0–36.0)
MCV: 94 fL (ref 80–100)
MONO ABS: 0.3 x10 3/mm (ref 0.2–0.9)
Monocyte %: 10.5 %
NEUTROS PCT: 56.5 %
Neutrophil #: 1.8 x10 3/mm (ref 1.4–6.5)
Platelet: 149 x10 3/mm — ABNORMAL LOW (ref 150–440)
RBC: 3.98 10*6/uL (ref 3.80–5.20)
RDW: 13.5 % (ref 11.5–14.5)
WBC: 3.3 x10 3/mm — ABNORMAL LOW (ref 3.6–11.0)

## 2014-12-07 LAB — COMPREHENSIVE METABOLIC PANEL
ALK PHOS: 79 U/L
ANION GAP: 7 (ref 7–16)
AST: 13 U/L — AB (ref 15–37)
Albumin: 3.8 g/dL (ref 3.4–5.0)
BUN: 18 mg/dL (ref 7–18)
Bilirubin,Total: 0.2 mg/dL (ref 0.2–1.0)
CALCIUM: 8.6 mg/dL (ref 8.5–10.1)
CO2: 30 mmol/L (ref 21–32)
Chloride: 104 mmol/L (ref 98–107)
Creatinine: 0.61 mg/dL (ref 0.60–1.30)
EGFR (African American): >60
GLUCOSE: 92 mg/dL (ref 65–99)
Osmolality: 283 (ref 275–301)
POTASSIUM: 3.7 mmol/L (ref 3.5–5.1)
SGPT (ALT): 23 U/L
SODIUM: 141 mmol/L (ref 136–145)
Total Protein: 7 g/dL (ref 6.4–8.2)

## 2014-12-07 LAB — HCG, QUANTITATIVE, PREGNANCY

## 2014-12-08 LAB — CANCER ANTIGEN 27.29: CA 27.29: 10.9 U/mL (ref 0.0–38.6)

## 2014-12-16 ENCOUNTER — Telehealth: Payer: Self-pay | Admitting: General Surgery

## 2014-12-16 NOTE — Telephone Encounter (Signed)
Reviewed recent PET results at the patient's request. Small area of skin activity in the right axilla likely related to excision of axillary nodes last fall. No increased activity around the left reconstructed breast.

## 2014-12-18 ENCOUNTER — Ambulatory Visit: Payer: Self-pay | Admitting: Oncology

## 2015-03-02 ENCOUNTER — Ambulatory Visit
Admit: 2015-03-02 | Disposition: A | Discharge status: Home / Self Care | Payer: Self-pay | Attending: Oncology | Admitting: Oncology

## 2015-03-08 ENCOUNTER — Encounter: Payer: Self-pay | Admitting: General Surgery

## 2015-03-08 ENCOUNTER — Ambulatory Visit (INDEPENDENT_AMBULATORY_CARE_PROVIDER_SITE_OTHER): Payer: 59 | Admitting: General Surgery

## 2015-03-08 VITALS — BP 130/72 | HR 76 | Resp 12 | Ht 63.0 in | Wt 151.0 lb

## 2015-03-08 DIAGNOSIS — R234 Changes in skin texture: Secondary | ICD-10-CM | POA: Insufficient documentation

## 2015-03-08 NOTE — Progress Notes (Signed)
Patient ID: Michelle Dawson, female   DOB: January 12, 1961, 54 y.o.   MRN: [id number]  Chief Complaint  Patient presents with  . Breast Problem    rash left breast    HPI Michelle Dawson is a 54 y.o. female.  Here today for evaluation of a red irritation on her left breast. No itching. She does have radiation changes. She feels like this is what she had last year. She had a PET in January 2016. She has a known history of left breast cancer in 2011. 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  She has seen infectious disease as well. HPI  Past Medical History  Diagnosis Date  . 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 The area of DCIS spanning about 3.5 cm in diameter. Margins were negative I 0.5 cm for the invasive cancer in less than 1 mm from the DCIS in multiple locations.  . Carpal tunnel syndrome 2001    nerve damage both arms  . Fibromyalgia 2001  . Sciatica 2012    Dr. [name] referred patient to pain clinic  . Arthritis 2004  . Breast screening, unspecified   . Malignant neoplasm of upper-outer quadrant of female breast 2011    treated with left breast wide excision, mastoplasty, and sentinel node biopsy for a T1b, N0, M0; The patient Oncotype score was low at 8% with hormone treatment alone. She did not receive adjuvant chemotherapy. She has been managed with tamoxifen alone.  . 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  . 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 Carpal tunnel release  2001  . Breast surgery Left 2011    left breast wide excision, mastoplasty, and sentinel node biopsy  . Breast biopsy Left 09/12/2013    Left breast stereotactic  . Breast  reconstruction Left 10/26/13  . Reconstruction breast w/ latissimus dorsi flap Left October 20, 2013    simple mastectomy sentinel node biopsy followed by a reconstruction by Aaron Edelman Coan,MD  . Portacath placement  05/11/14  . Port-a-cath removal  Dec 2015  . Axillary lymph node biopsy Right June 2015    Family History  Problem Relation Age of Onset  . Melanoma Father     malignant melanoma  . Cancer Father     bladder  . Alzheimer's disease Mother   . Cancer Mother     breast    Social History History  Substance Use Topics  . Smoking status: Never Smoker   . Smokeless tobacco: Never Used  . Alcohol Use: No    No Known Allergies  Current Outpatient Prescriptions  Medication Sig Dispense Refill  . Ascorbic Acid (VITAMIN C WITH ROSE HIPS) 1000 MG tablet Take 1,000 mg by mouth 2 (two) times daily.    . Biotin 10 MG TABS Take by mouth daily.    . Calcium Carbonate-Vitamin D (CALCIUM 500 + D) 500-125 MG-UNIT TABS Take by mouth daily.    . Cholecalciferol (VITAMIN D) 2000 UNITS tablet Take 2,000 Units by mouth daily.    . Coenzyme Q10 (CO Q 10 PO) Take 1 tablet by mouth daily.    . folic acid (FOLVITE) 034 MCG tablet Take 400 mcg by mouth daily.    Marland Kitchen  hydrocodone-acetaminophen (LORCET-HD) 5-500 MG per capsule Take 1 capsule by mouth 2 (two) times daily.    . IRON PO Take by mouth.    . L-Lysine 1000 MG TABS Take by mouth 2 (two) times daily.    . Multiple Vitamins-Minerals (WOMENS DAILY FORMULA PO) Take by mouth.    . Probiotic Product (PROBIOTIC DAILY PO) Take by mouth.    . tamoxifen (NOLVADEX) 20 MG tablet Take 20 mg by mouth daily.  2  . vitamin A 10000 UNIT capsule Take 10,000 Units by mouth daily.    . vitamin B-12 (CYANOCOBALAMIN) 1000 MCG tablet Take 1,000 mcg by mouth daily.    . vitamin E 400 UNIT capsule Take 400 Units by mouth daily.    . Zinc Sulfate (ZINC 15 PO) Take by mouth daily.    Marland Kitchen zolpidem (AMBIEN CR) 12.5 MG CR tablet Take 12.5 mg by mouth daily.     No  current facility-administered medications for this visit.    Review of Systems Review of Systems  Constitutional: Negative.   Respiratory: Negative.   Cardiovascular: Negative.     Blood pressure 130/72, pulse 76, resp. rate 12, height 5' 3"  (1.6 m), weight 151 lb (68.493 kg).  Physical Exam Physical Exam  Constitutional: She is oriented to person, place, and time. She appears well-developed and well-nourished.  Neck: Neck supple.  Cardiovascular: Normal rate, regular rhythm and normal heart sounds.   Pulmonary/Chest: Effort normal and breath sounds normal. Right breast exhibits no inverted nipple, no mass, no nipple discharge, no skin change and no tenderness. Left breast exhibits skin change. Left breast exhibits no inverted nipple, no mass, no nipple discharge and no tenderness.    Left reconstructed flap 2 x 5 cm and 2 -1 x 1 areas of erythremia.  Lymphadenopathy:    She has no cervical adenopathy.    She has no axillary adenopathy.  Neurological: She is alert and oriented to person, place, and time.  Skin: Skin is warm and dry.    Data Reviewed Medical oncology notes of 12/15/2014. No evidence of disease on most recent PET scan. Inflammatory carcinoma, stage IIIB. ER positive, PR positive, HER-2 negative. Previous treatment with Cytoxan and Adriamycin. Poor tolerance of Femara and exemestane.  Assessment    Dermal changes unlikely related to recurrent malignancy.    Plan    The patient reports that when she initially developed evidence of stage IV disease the pattern on the native breast skin was similar to that which is seen today on the latissimus flap. In light of this report it was elected to proceed to biopsy. 3 mL of 0.5% Xylocaine with 0.25% Marcaine with 1-200,000 of epinephrine was utilized well tolerated. ChloraPrep was applied to the skin. 2.5 mm punch biopsies 2 were completed. Skin defect approximately with 4-0 nylon. Telfa and Tegaderm dressing applied.  Postbiopsy instructions were reviewed with the patient. She will return in one week for suture removal with the nurse.       PCP:  Margarita Rana Dr. Tula Nakayama Ref: Dr Tonny Bollman, Forest Gleason 03/08/2015, 9:31 PM

## 2015-03-08 NOTE — Patient Instructions (Signed)
The patient is aware to call back for any questions or concerns.  

## 2015-03-09 NOTE — Op Note (Signed)
PATIENT NAME:  Michelle Dawson, Michelle Dawson MR#:  [id number] DATE OF BIRTH:  May 05, 1961  DATE OF PROCEDURE:  10/26/2013  PREOPERATIVE DIAGNOSIS: Recurrent left breast cancer, status post lumpectomy and radiation therapy.   POSTOPERATIVE DIAGNOSIS: Recurrent left breast cancer, status post lumpectomy and radiation therapy.   PROCEDURES:  1. Assistant to Dr. Bary Castilla for left breast mastectomy.  2. Immediate breast reconstruction with latissimus dorsi flap.  3. Placement of tissue expander.   STAFF SURGEON: Nicholaus Bloom, M.D.   ANESTHESIA: General endotracheal.   ESTIMATED BLOOD LOSS: 50 mL.   COMPLICATIONS: None.   DRAINS: A 19-French Blake drain x 2, one in the back and one in the breast.   ASSISTANT: Link Snuffer, PA ([facility]).   STATEMENT OF NECESSITY: The patient is a very pleasant 54 year old female who has a history of left breast cancer status post lumpectomy and radiation therapy. She has had a recurrence of DCIS on mammogram and has been evaluated and elected for completion mastectomy and immediate reconstruction. She and I have discussed the reconstructive options, including free tissue transfer and autologous tissue transfer with implant. The patient has selected for immediate reconstruction with latissimus dorsi flap. The risks, benefits and alternatives have been thoroughly discussed with her, including the possibility of secondary procedures for nipple reconstruction and placement of formal implant as well as contralateral matching procedures. She has requested the procedure as described. Additionally, I have consulted with [facility] as there is no qualified surgical resident or assistant, and it was felt that this would be beneficial to her intraoperative care and potentially postoperative management.   STATEMENT OF PROCEDURE: The patient was brought to the operating room awake, alert and comfortable and placed on the OR table in the supine position. After endotracheal anesthesia was  introduced and secured, SCDs were on and functioning, a Foley catheter was inserted and she was placed in the right lateral decubitus position with an axillary roll. Bony prominences were padded. Airway was secured, and the patient was then prepped and draped in the usual sterile fashion. This did include the entire left arm in the field. At this point, I assisted Dr. Bary Castilla in the mastectomy. He will dictate this note as a separate procedure note.   At the completion of the mastectomy, the patient had thin but well-vascularized tissue flaps. There were no significant contour abnormalities. The IMF was intact. There was a very significant amount of radiation fibrosis along the lateral chest wall and breast border. No axillary dissection was performed by him. The defect was measured at 18 cm wide x 10 cm in height and to facilitate some of the breast lift, an 8 cm wide x 15 cm paddle was fashioned in the relaxed skin tension lines of the back. A latissimus flap was elevated in the usual fashion. An ellipse of skin was incised sharply and the sub-Scarpa fat was maintained along the latissimus for approximately 10 cm in either direction. Dissection in the subcutaneous plane then continued to the furthest extents of the latissimus muscle. Beginning superiorly and underneath the trapezius, the latissimus muscle was dissected away from the midline down to the inferior attachments which were then divided. Sweeping medially to laterally, the posterior deep perforators were identified and clipped doubly on each side and then cauterized to divide them. The latissimus then was swept out of the inferior portion of the chest and then up to the axilla. The fat pad over top of serratus was maintained in place and by this point, the flap  was highly mobile, and the beginning of the serratus branch and the distal extent of the thoracodorsal vessels were visualized. Next, a tunnel was fashioned between the chest defect and the back  defect. Using gentle retraction in the axilla, the radiated concrete fibrotic tissue was incised, and the soft more superior fat was identified. Thoracodorsal and serratus branches were clearly visualized and at the trunk superiorly, the nerve to the latissimus flap was identified, pinched and then clipped on either side and divided. At this point, the pedicle was isolated, and approximately 90% of the muscular insertion in the humerus was divided. No bleeding was encountered. The flap easily swung through the lateral chest, re-creating the softer border of the breast. The distal end was pink and viable. The posterior wound was then irrigated and examined for hemostasis. The posterior back wound was closed over a 19-French Blake drain which was brought out anteriorly. Deep fascial sutures of 0 PDS followed by 3-0 Vicryl and 4-0 subcuticular stitch were completed. Rolling the patient slightly posteriorly, the insetting of the flap was performed by first tacking the distal extent of the latissimus down to the IMF. This created a folded-over pocket, and then the 12 cm wide expander was prepared.  Projection expander was placed underneath the latissimus and above the pectoralis and secured on all 3 corners. The superior edge of the latissimus was sutured to the anterior surface of the pectoralis, completely covering the expander. Next, the skin was redraped and tailor tacked. Several small darts were made in order to facilitate this. The inferior tissue was removed preferentially, and a new breast mound shape was created. The skin edges were cut sharply without electrocautery and produced very nice bleeding. Finally, the wound was closed with 3-0 Vicryl and a running 4-0 black nylon. The wound was dressed with antibiotic ointment and sterile dressing. Needle, sponge and lap counts were correct. The flap remained pink the entire time. There were no apparent complications. She was placed in a lightly compressive bra and  transferred to recovery in good condition.    ____________________________ Cleda Daub, MD bsc:gb D: 10/27/2013 22:02:00 ET T: 10/27/2013 22:41:40 ET JOB#: [id number]  cc: Cleda Daub, MD, <Dictator> Cleda Daub MD ELECTRONICALLY SIGNED 11/03/2013 9:24

## 2015-03-09 NOTE — Op Note (Signed)
PATIENT NAME:  Michelle Dawson, Michelle Dawson MR#:  [id number] DATE OF BIRTH:  [birth year]  DATE OF PROCEDURE:  10/26/2013  PREOPERATIVE DIAGNOSIS: Recurrent left breast cancer.   POSTOPERATIVE DIAGNOSIS: Recurrent left breast cancer.   OPERATIVE PROCEDURE: Left simple mastectomy, latissimus flap reconstruction.   SURGEON: Hervey Ard, MD/Brian Tula Nakayama, MD   ANESTHESIA: General endotracheal under Dr. Carolin Sicks.   ESTIMATED BLOOD LOSS: Less than 100 mL.   CLINICAL NOTE: This 54 year old woman had an early stage invasive cancer of the left breast 3-1/2 years ago. She was noted to have a change in her mammogram, and biopsy showed evidence of DCIS. She desired breast reconstruction. Plans are for a salvage mastectomy followed by immediate reconstruction. The patient was injected with technetium sulfur colloid prior to the procedure.   OPERATIVE NOTE: With the patient under adequate general anesthesia, 5 mL of methylene blue diluted 1:2 with normal saline was injected in the subareolar plexus. The patient was then placed into the right lateral decubitus position for planned latissimus flap reconstruction to be completed by Dr. Tula Nakayama. The mastectomy flaps were outlined, and the skin was incised sharply along the elliptical incision and the remaining dissection completed with electrocautery. Hemostasis was with 3-0 Vicryl ties and cautery. The breast was excised 2 fingerbreadths below the clavicle, the serratus muscle laterally, and to the inframammary fold inferiorly. Scanning through the axilla showed no areas of increased uptake and no blue lymphatics. The breast was elevated off the underlying pectoralis muscle, taking the fascia of that muscle with the specimen. It was orientated and sent fresh to pathology per protocol.   At this point, latissimus flap reconstruction was undertaken by Dr. Tula Nakayama and will be dictated separately.    ____________________________ Robert Bellow, MD jwb:jcm D: 10/26/2013 21:11:00  ET T: 10/26/2013 22:00:53 ET JOB#: [id number]  cc: Robert Bellow, MD, <Dictator> Cleda Daub, MD JEFFREY Amedeo Kinsman MD ELECTRONICALLY SIGNED 10/27/2013 16:37

## 2015-03-10 NOTE — Op Note (Signed)
PATIENT NAME:  Michelle Dawson, Michelle Dawson MR#:  [id number] DATE OF BIRTH:  18-Jul-1961  DATE OF PROCEDURE:  03/31/2014  PREOPERATIVE DIAGNOSES: 1.  Inflammatory breast cancer. 2.  Rash infection following reconstructive surgery requiring intravenous antibiotics.  POSTOPERATIVE DIAGNOSES: 1.  Inflammatory breast cancer. 2.  Rash infection following reconstructive surgery requiring intravenous antibiotics.  PROCEDURES:  1. Ultrasound guidance for vascular access to left brachial vein.  2. Fluoroscopic guidance for placement of catheter.  3. Insertion of peripherally inserted central venous catheter, double lumen, left arm.  SURGEON: Leotis Pain, MD  ANESTHESIA: Local.   ESTIMATED BLOOD LOSS: Minimal.   INDICATION FOR PROCEDURE: A 54 year old female with inflammatory breast cancer, who developed a rash and infection following reconstructive breast surgery and we are asked to place a PICC line for extended IV vancomycin therapy.   DESCRIPTION OF PROCEDURE: The patient's left arm was sterilely prepped and draped, and a sterile surgical field was created. The left brachial vein was accessed under direct ultrasound guidance without difficulty with a micropuncture needle and permanent image was recorded. 0.018 wire was then placed into the superior vena cava. Peel-away sheath was placed over the wire. A single lumen peripherally inserted central venous catheter was then placed over the wire and the wire and peel-away sheath were removed. The catheter tip was placed into the superior vena cava and was secured at the skin at 38 cm with a sterile dressing. The catheter withdrew blood well and flushed easily with heparinized saline. The patient tolerated procedure well.  ____________________________ Algernon Huxley, MD jsd:aw D: 04/28/2014 12:03:23 ET T: 04/28/2014 12:14:47 ET JOB#: [id number]  cc: Algernon Huxley, MD, <Dictator> Algernon Huxley MD ELECTRONICALLY SIGNED 05/04/2014 10:50

## 2015-03-10 NOTE — Op Note (Signed)
PATIENT NAME:  Michelle Dawson, Michelle Dawson MR#:  [id number] DATE OF BIRTH:  1961-05-01  DATE OF PROCEDURE:  05/11/2014  PREOPERATIVE DIAGNOSES:  1.  Right axillary adenopathy.  2.  Need for central venous access.   POSTOPERATIVE DIAGNOSES:  1.  Right axillary adenopathy.  2.  Need for central venous access.   OPERATIVE PROCEDURES:  1. Excision of right axillary lymph node.  2. Placement of right subclavian PowerPort.   SURGEON: Hervey Ard, MD   ANESTHESIA: General by LMA, Marcaine 0.5% plain 20 mL local infiltration.   ESTIMATED BLOOD LOSS: Minimal.   CLINICAL NOTE: This 54 year old woman has developed inflammatory carcinoma of the left breast, status post previous wide excision and axillary dissection. She has also been identified with right axillary adenopathy with increased uptake on PET scanning. She is a candidate for adjuvant chemotherapy and elected to resect the last site of all known metastatic disease outside of the breast at the same time.   OPERATIVE NOTE: With the patient under adequate general anesthesia and in the right arm extended and the right shoulder supported on a small bolster, the axilla, chest, and upper arm were prepped with ChloraPrep and draped. The palpable adenopathy was approached after infiltration of local anesthesia through a transverse incision. Remaining dissection was completed with electrocautery. A dominant large node and two smaller nodes were removed and sent for histology. ER/PR, HER-2/neu receptor status was requested. The wound was closed in layers with 2-0 Vicryl to the axillary envelope and then a running 4-0 Vicryl subcuticular suture for the skin. Benzoin and Steri-Strips followed by Telfa and Tegaderm dressing was applied.   The bolster was removed and the right arm placed adjacent to the table and the right chest re-prepped with ChloraPrep. In Trendelenburg position, ultrasound was used to confirm patency of the subclavian vein. With some difficulty,  the vein was cannulated under ultrasound guidance followed by passage of the guidewire. He did hanging on the junction of the right subclavian and the innominate vein, and under fluoroscopic control, this was directed into the heart. The dilator was placed and the catheter positioned at the junction of the right atrium and SVC. It was tunneled to a pocket on the right anterior chest and anchored to the deep tissue with 3-0 Prolene sutures. The catheter easily irrigated and aspirated in this location. The wound was closed with 3-0 Vicryl to the adipose layer and a running 4-0 Vicryl subcuticular suture for the skin. The patient is scheduled for her first chemotherapy treatment tomorrow, and after application of benzoin and Steri-Strips, the port was cannulated, flushed with saline, and placed under a Tegaderm dressing to allow easy access in the postoperative period.   The patient tolerated the procedure well and postprocedure chest x-ray showed no pneumothorax and the catheter tip as described above.     ____________________________ Robert Bellow, MD jwb:ts D: 05/12/2014 11:15:03 ET T: 05/12/2014 11:37:05 ET JOB#: [id number]  cc: Robert Bellow, MD, <Dictator> Jerrell Belfast, MD Martie Lee. Oliva Bustard, MD Rossie Scarfone Amedeo Kinsman MD ELECTRONICALLY SIGNED 05/12/2014 16:02

## 2015-03-12 ENCOUNTER — Ambulatory Visit: Payer: Self-pay | Admitting: General Surgery

## 2015-03-13 ENCOUNTER — Encounter: Payer: Self-pay | Admitting: *Deleted

## 2015-03-13 ENCOUNTER — Telehealth: Payer: Self-pay | Admitting: General Surgery

## 2015-03-13 NOTE — Telephone Encounter (Signed)
The patient was notified that the punch biopsies of the skin overlying the latissimus flap indeed show dermal lymphatic invasion with breast cancer. This was confirmed with ER receptor analysis on the submitted specimen.  She will contact Dr. Grayland Ormond to arrange to discuss treatment options.

## 2015-03-15 ENCOUNTER — Ambulatory Visit (INDEPENDENT_AMBULATORY_CARE_PROVIDER_SITE_OTHER): Payer: 59 | Admitting: *Deleted

## 2015-03-15 ENCOUNTER — Ambulatory Visit
Admit: 2015-03-15 | Disposition: A | Discharge status: Home / Self Care | Payer: Self-pay | Attending: Oncology | Admitting: Oncology

## 2015-03-15 DIAGNOSIS — R234 Changes in skin texture: Secondary | ICD-10-CM

## 2015-03-15 LAB — HCG, QUANTITATIVE, PREGNANCY

## 2015-03-15 NOTE — Progress Notes (Signed)
Patient came in today for a wound check left breast.  The wound is clean, with no signs of infection noted. Follow up as scheduled. The sutures were removed and steri strips applied.

## 2015-03-15 NOTE — Patient Instructions (Signed)
Patient to return as scheduled.  

## 2015-03-22 ENCOUNTER — Ambulatory Visit: Payer: Self-pay | Admitting: Oncology

## 2015-03-26 ENCOUNTER — Encounter: Payer: Self-pay | Admitting: Radiation Oncology

## 2015-03-26 ENCOUNTER — Ambulatory Visit
Admission: RE | Admit: 2015-03-26 | Discharge: 2015-03-26 | Disposition: A | Discharge status: Home / Self Care | Payer: 59 | Source: Ambulatory Visit | Attending: Oncology | Admitting: Oncology

## 2015-03-26 ENCOUNTER — Inpatient Hospital Stay: Payer: 59 | Attending: Oncology | Admitting: Oncology

## 2015-03-26 VITALS — BP 123/70 | HR 69 | Temp 97.2°F | Resp 20 | Wt 149.7 lb

## 2015-03-26 DIAGNOSIS — F419 Anxiety disorder, unspecified: Secondary | ICD-10-CM | POA: Insufficient documentation

## 2015-03-26 DIAGNOSIS — G629 Polyneuropathy, unspecified: Secondary | ICD-10-CM | POA: Diagnosis not present

## 2015-03-26 DIAGNOSIS — M797 Fibromyalgia: Secondary | ICD-10-CM | POA: Insufficient documentation

## 2015-03-26 DIAGNOSIS — Z853 Personal history of malignant neoplasm of breast: Secondary | ICD-10-CM | POA: Insufficient documentation

## 2015-03-26 DIAGNOSIS — Z170 Estrogen receptor positive status [ER+]: Secondary | ICD-10-CM | POA: Diagnosis not present

## 2015-03-26 DIAGNOSIS — Z79899 Other long term (current) drug therapy: Secondary | ICD-10-CM

## 2015-03-26 DIAGNOSIS — M79622 Pain in left upper arm: Secondary | ICD-10-CM | POA: Insufficient documentation

## 2015-03-26 DIAGNOSIS — Z8052 Family history of malignant neoplasm of bladder: Secondary | ICD-10-CM | POA: Diagnosis not present

## 2015-03-26 DIAGNOSIS — Z803 Family history of malignant neoplasm of breast: Secondary | ICD-10-CM | POA: Diagnosis not present

## 2015-03-26 DIAGNOSIS — C50912 Malignant neoplasm of unspecified site of left female breast: Secondary | ICD-10-CM | POA: Insufficient documentation

## 2015-03-26 NOTE — Consult Note (Signed)
Radiation Oncology NEW PATIENT EVALUATION  Name: Michelle Dawson  MRN: [id number]  Date:   03/26/2015     DOB: [birth year]   This 54 y.o. 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  REFERRING PHYSICIAN: Margarita Rana, MD  CHIEF COMPLAINT:  Chief Complaint  Patient presents with  . Breast Cancer    OPNA, pt is here for recurrent inflammatory breast cancer    DIAGNOSIS: The encounter diagnosis was Malignant neoplasm of female breast, left.   PREVIOUS INVESTIGATIONS:  PET CT scan reviewed Clinical notes reviewed Pathology reports reviewed  HPI: Patient is a 54 year old female well known to our department having been treated 5 years prior to her left breast for ER/PR positive HER-2/neu negative lesion in the upper outer quadrant with a low score of recurrence risk by Oncotype DX. She went on to have recurrent left breast cancer in December 2014 and underwent a left mastectomy with a latissimus dorsi flap and tissue expanders placed. She did well although about a year ago presented with a right axillary lymph node underwent biopsy 3 lymph nodes 1 showing metastatic breast cancer. She is now had breast reconstruction performed although has developed dermal lymphatic invasion by tumor cells on punch biopsy of her left breast consistent with inflammatory carcinoma. She went on to have a PET CT scan recently on 03/15/2015 showing no evidence to suggest residual or recurrent disease. She's been treated with multiple chemotherapy regimens under medical oncology's direction. I been asked to evaluate her for inflammatory carcinoma of the left breast at this time with possible concurrent chemotherapy. 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 She specifically denies breast tenderness cough or bone pain.  PLANNED TREATMENT REGIMEN: Whole breast and peripheral lymphatic radiation up to 5000 cGy  with concurrent platinum based chemotherapy  PAST MEDICAL HISTORY:  has a past medical history of Personal history of malignant neoplasm of breast (2011); Carpal tunnel syndrome (2001); Fibromyalgia (2001); Sciatica (2012); Arthritis (2004); Breast screening, unspecified; Malignant neoplasm of upper-outer quadrant of female breast (2011); Malignant neoplasm of upper-outer quadrant of female breast (09/12/2013); and Inflammatory breast cancer (June 2015).    PAST SURGICAL HISTORY:  Past Surgical History  Procedure Laterality Date  . Carpal tunnel release  2001  . Breast surgery Left 2011    left breast wide excision, mastoplasty, and sentinel node biopsy  . Breast biopsy Left 09/12/2013    Left breast stereotactic  . Breast reconstruction Left 10/26/13  . Reconstruction breast w/ latissimus dorsi flap Left October 20, 2013    simple mastectomy sentinel node biopsy followed by a reconstruction by Aaron Edelman Coan,MD  . Portacath placement  05/11/14  . Port-a-cath removal  Dec 2015  . Axillary lymph node biopsy Right June 2015    FAMILY HISTORY: family history includes Alzheimer's disease in her mother; Cancer in her father and mother; Melanoma in her father.  SOCIAL HISTORY:  reports that she has never smoked. She has never used smokeless tobacco. She reports that she does not drink alcohol or use illicit drugs.  ALLERGIES: Review of patient's allergies indicates no known allergies.  MEDICATIONS:  Current Outpatient Prescriptions  Medication Sig Dispense Refill  . Ascorbic Acid (VITAMIN C WITH ROSE HIPS) 1000 MG tablet Take 1,000 mg by mouth 2 (two) times daily.    . Biotin 10 MG TABS Take 5,000 mg by mouth daily.     . Calcium Carbonate-Vitamin D (CALCIUM 500 +  D) 500-125 MG-UNIT TABS Take by mouth daily.    . Cholecalciferol (VITAMIN D) 2000 UNITS tablet Take 600 Units by mouth daily.     . Coenzyme Q10 (CO Q 10 PO) Take 1 tablet by mouth daily.    . folic acid (FOLVITE) 121 MCG tablet  Take 400 mcg by mouth daily.    . hydrocodone-acetaminophen (LORCET-HD) 5-500 MG per capsule Take 1 capsule by mouth 2 (two) times daily.    . IRON PO Take by mouth.    . L-Lysine 1000 MG TABS Take by mouth 2 (two) times daily.    . Multiple Vitamins-Minerals (WOMENS DAILY FORMULA PO) Take by mouth.    . Probiotic Product (PROBIOTIC DAILY PO) Take by mouth.    . tamoxifen (NOLVADEX) 20 MG tablet Take 20 mg by mouth daily.  2  . vitamin A 10000 UNIT capsule Take 8,000 Units by mouth daily.     . vitamin B-12 (CYANOCOBALAMIN) 1000 MCG tablet Take 2,000 mcg by mouth daily.     . vitamin E 400 UNIT capsule Take 400 Units by mouth daily.    . Zinc Sulfate (ZINC 15 PO) Take 50 mg by mouth daily.     Marland Kitchen zolpidem (AMBIEN CR) 12.5 MG CR tablet Take 12.5 mg by mouth daily.    Marland Kitchen amoxicillin (AMOXIL) 500 MG capsule      No current facility-administered medications for this encounter.    ECOG PERFORMANCE STATUS:  ECOG status 0  REVIEW OF SYSTEMS:  Patient denies any weight loss, fatigue, weakness, fever, chills or night sweats. Patient denies any loss of vision, blurred vision. Patient denies any ringing  of the ears or hearing loss. No irregular heartbeat. Patient denies heart murmur or history of fainting. Patient denies any chest pain or pain radiating to her upper extremities. Patient denies any shortness of breath, difficulty breathing at night, cough or hemoptysis. Patient denies any swelling in the lower legs. Patient denies any nausea vomiting, vomiting of blood, or coffee ground material in the vomitus. Patient denies any stomach pain. Patient states has had normal bowel movements no significant constipation or diarrhea. Patient denies any dysuria, hematuria or significant nocturia. Patient denies any problems walking, swelling in the joints or loss of balance. Patient denies any skin changes, loss of hair or loss of weight. Patient denies any excessive worrying or anxiety or significant depression.  Patient denies any problems with insomnia. Patient denies excessive thirst, polyuria, polydipsia. Patient denies any swollen glands, patient denies easy bruising or easy bleeding. Patient denies any recent infections, allergies or URI. Patient "s visual fields have not changed significantly in recent time.    PHYSICAL EXAM: BP 123/70 mmHg  Pulse 69  Temp(Src) 97.2 F (36.2 C)  Resp 20  Wt 149 lb 11.1 oz (67.9 kg) Well-developed well-nourished female in NAD. She status post breast reduction on the right and breast reconstruction on the left without reconstruction of the nipple areolar complex. The left breast is tight and firm around the implant. She does have some bands of erythematous tissue of the left breast consistent with inflammatory carcinoma. No axillary or supraclavicular adenopathy is identified. Well-developed well-nourished patient in NAD. HEENT reveals PERLA, EOMI, discs not visualized.  Oral cavity is clear. No oral mucosal lesions are identified. Neck is clear without evidence of cervical or supraclavicular adenopathy. Lungs are clear to A&P. Cardiac examination is essentially unremarkable with regular rate and rhythm without murmur rub or thrill. Abdomen is benign with no organomegaly or masses  noted. Motor sensory and DTR levels are equal and symmetric in the upper and lower extremities. Cranial nerves II through XII are grossly intact. Proprioception is intact. No peripheral adenopathy or edema is identified. No motor or sensory levels are noted. Crude visual fields are within normal range.   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 We'll present her case at our weekly tumor  conference. Would favor combined modality treatment with chemotherapy and radiation. I will plan on delivering 5000 cGy which I believe her tissue can still tolerate although I have made are well aware of the risks of contraction around her implant which may necessitate its removal some point. Other risks of treatment including inclusion of superficial lung, possible lymphedema of her left upper extremity, fatigue, alteration of blood counts, and skin reaction all were described in detail to the patient. She is seeing medical oncology today to discuss chemotherapy intervention. I have set up and ordered CT simulation later this week.  I would like to take this opportunity for allowing me to participate in the care of your patient.Armstead Peaks., MD

## 2015-03-28 ENCOUNTER — Telehealth: Payer: Self-pay | Admitting: *Deleted

## 2015-03-28 NOTE — Telephone Encounter (Signed)
Oncology Scheduler is calling regarding surgery referral.. She sent a fax request for release of information. received records from [facility]. wants to verify clinical information. "Has patient ever had an MRI of breast ordered by Dr. Grayland Ormond?"  RN reviewed chart. I explained that there is no past clinical order for an MRI of breast or any documentation on chart where one has been performed.

## 2015-03-28 NOTE — Telephone Encounter (Signed)
Called asking what she is to be doing for the next couple of weeks

## 2015-03-28 NOTE — Telephone Encounter (Signed)
Called to inform that nothing special needs to be done before he sees her again. She then she asked when she is to stop taking the Tamoxifen and wha tthe name of the chemo she will be getting is. I told her I would check with Dr Grayland Ormond and get back to her

## 2015-03-28 NOTE — Telephone Encounter (Signed)
Informed to stop Tamoxifen now and that she will be getting Cisplatin

## 2015-03-29 ENCOUNTER — Ambulatory Visit: Payer: 59

## 2015-03-30 NOTE — Progress Notes (Signed)
[facility]  Telephone:([phone]) [phone] [phone]) [phone]  ID: Michelle Dawson OB: 1961-11-04  MR#: [id number]  WUX#:[id number]  Patient Care Team: Margarita Rana, MD as PCP - General (Family Medicine) Robert Bellow, MD as Consulting Physician (General Surgery) Margarita Rana, MD as Referring Physician (Family Medicine) Lloyd Huger, MD as Consulting Physician (Oncology)  CHIEF COMPLAINT: Recurrent inflammatory breast cancer, T4b,N0 M1 stage IV disease, ER positive, PR positive, HER-2 negative on the skin biopsy.  INTERVAL HISTORY:  Patient returns to clinic today to discuss her pathology results and treatment planning.  She is highly anxious, but otherwise feels well.  She denies any fevers. She has no neurologic complaints. She has a good appetite and denies weight loss. She denies any pain. She has no nausea, vomiting, constipation, or diarrhea. She has no urinary complaints. Patient offers no further specific complaints today.  REVIEW OF SYSTEMS:   Review of Systems  Constitutional: Negative.   Respiratory: Negative.   Cardiovascular: Negative.   Psychiatric/Behavioral: The patient is nervous/anxious.     As per HPI. Otherwise, a complete review of systems is negatve.  PAST MEDICAL HISTORY: Past Medical History  Diagnosis Date  . 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 The area of DCIS spanning about 3.5 cm in diameter. Margins were negative I 0.5 cm for the invasive cancer in less than 1 mm from the DCIS in multiple locations.  . Carpal tunnel syndrome 2001    nerve damage both arms  . Fibromyalgia 2001  . Sciatica 2012    Dr. [name] referred patient to pain clinic  . Arthritis 2004  . Breast screening, unspecified   . Malignant neoplasm of upper-outer quadrant of female breast 2011    treated with left  breast wide excision, mastoplasty, and sentinel node biopsy for a T1b, N0, M0; The patient Oncotype score was low at 8% with hormone treatment alone. She did not receive adjuvant chemotherapy. She has been managed with tamoxifen alone.  . 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  . Inflammatory breast cancer June 2015    PAST SURGICAL HISTORY: Past Surgical History  Procedure Laterality Date  . Carpal tunnel release  2001  . Breast surgery Left 2011    left breast wide excision, mastoplasty, and sentinel node biopsy  . Breast biopsy Left 09/12/2013    Left breast stereotactic  . Breast reconstruction Left 10/26/13  . Reconstruction breast w/ latissimus dorsi flap Left October 20, 2013    simple mastectomy sentinel node biopsy followed by a reconstruction by Aaron Edelman Coan,MD  . Portacath placement  05/11/14  . Port-a-cath removal  Dec 2015  . Axillary lymph node biopsy Right June 2015    FAMILY HISTORY Family History  Problem Relation Age of Onset  . Melanoma Father     malignant melanoma  . Cancer Father     bladder  . Alzheimer's disease Mother   . Cancer Mother     breast       ADVANCED DIRECTIVES:    HEALTH MAINTENANCE: History  Substance Use Topics  . Smoking status: Never Smoker   . Smokeless tobacco: Never Used  . Alcohol Use: No     Colonoscopy:  PAP:  Bone density:  Lipid panel:  No Known Allergies  Current Outpatient Prescriptions  Medication Sig Dispense Refill  . amoxicillin (AMOXIL) 500 MG capsule     . Ascorbic Acid (VITAMIN C WITH ROSE HIPS) 1000 MG tablet Take 1,000 mg by mouth 2 (two) times daily.    . Biotin 10 MG TABS Take 5,000 mg by mouth daily.     . Calcium Carbonate-Vitamin D (CALCIUM 500 + D) 500-125 MG-UNIT TABS Take by mouth daily.    . Cholecalciferol (VITAMIN D) 2000 UNITS tablet Take 600 Units by mouth daily.     . Coenzyme Q10 (CO Q 10 PO)  Take 1 tablet by mouth daily.    . folic acid (FOLVITE) 400 MCG tablet Take 400 mcg by mouth daily.    . hydrocodone-acetaminophen (LORCET-HD) 5-500 MG per capsule Take 1 capsule by mouth 2 (two) times daily.    . IRON PO Take by mouth.    . L-Lysine 1000 MG TABS Take by mouth 2 (two) times daily.    . Multiple Vitamins-Minerals (WOMENS DAILY FORMULA PO) Take by mouth.    . Probiotic Product (PROBIOTIC DAILY PO) Take by mouth.    . tamoxifen (NOLVADEX) 20 MG tablet Take 20 mg by mouth daily.  2  . vitamin A 10000 UNIT capsule Take 8,000 Units by mouth daily.     . vitamin B-12 (CYANOCOBALAMIN) 1000 MCG tablet Take 2,000 mcg by mouth daily.     . vitamin E 400 UNIT capsule Take 400 Units by mouth daily.    . Zinc Sulfate (ZINC 15 PO) Take 50 mg by mouth daily.     Marland Kitchen zolpidem (AMBIEN CR) 12.5 MG CR tablet Take 12.5 mg by mouth daily.     No current facility-administered medications for this visit.    OBJECTIVE: There were no vitals filed for this visit.   There is no weight on file to calculate BMI.    ECOG FS:0 - Asymptomatic  General: Well-developed, well-nourished, no acute distress. Eyes: anicteric sclera.  Breasts: Reconstructed left breast with 3 distinct erythematous regions, no palpable masses. Exam deferred today. Lungs: Clear to auscultation bilaterally. Heart: Regular rate and rhythm. No rubs, murmurs, or gallops. Abdomen: Soft, nontender, nondistended. No organomegaly noted, normoactive bowel sounds. Musculoskeletal: No edema, cyanosis, or clubbing. Neuro: Alert, answering all questions appropriately. Cranial nerves grossly intact. Skin: No rashes or petechiae noted. Psych: Normal affect.    LAB RESULTS:  Lab Results  Component Value Date   NA 141 12/07/2014   K 3.7 12/07/2014   CL 104 12/07/2014   CO2 30 12/07/2014   GLUCOSE 92 12/07/2014   BUN 18 12/07/2014   CREATININE 0.61 12/07/2014   CALCIUM 8.6 12/07/2014   PROT 7.0 12/07/2014   ALBUMIN 3.8 12/07/2014    AST 13* 12/07/2014   ALT 23 12/07/2014   ALKPHOS 79 12/07/2014   GFRNONAA >60 08/04/2014   GFRAA >60 08/04/2014    Lab Results  Component Value Date   WBC 3.3* 12/07/2014   NEUTROABS 1.8 12/07/2014   HGB 12.5 12/07/2014   HCT 37.3 12/07/2014   MCV 94 12/07/2014   PLT 149* 12/07/2014     STUDIES: Nm Pet Image Restag (ps) Skull Base To Thigh  03/15/2015   CLINICAL DATA:  Subsequent Treatment strategy for recurrent breast cancer.  EXAM: NUCLEAR MEDICINE PET SKULL BASE TO THIGH  TECHNIQUE: 13.2 mCi F-18 FDG was injected intravenously. Full-ring PET imaging was performed from the skull base to thigh after the radiotracer. CT data was obtained and used for attenuation correction and anatomic localization.  FASTING  BLOOD GLUCOSE:  Value: 91 mg/dl  COMPARISON:  12/07/2014  FINDINGS: NECK  No hypermetabolic lymph nodes in the neck.  CHEST  Status post left breast reconstruction. Status post left axillary nodal dissection. Several sub cm lymph nodes are identified within the right axilla. The largest measures 6 mm. These have an SUV max equal to 2.76 No hypermetabolic mediastinal or hilar nodes. No suspicious pulmonary nodules on the CT scan.  ABDOMEN/PELVIS  No abnormal hypermetabolic activity within the liver, pancreas, adrenal glands, or spleen. No hypermetabolic lymph nodes in the abdomen or pelvis.  SKELETON  No focal hypermetabolic activity to suggest skeletal metastasis.  IMPRESSION: 1. Stable PET-CT. No specific features identified to suggest residual/recurrent tumor or distant metastatic disease. 2. Similar appearance of mild nonspecific FDG uptake   Electronically Signed   By: Kerby Moors M.D.   On: 03/15/2015 15:17    ASSESSMENT: Recurrent stage IV inflammatory breast cancer. ER/PR positive, HER-2 negative. BRCA1 and 2 negative.  PLAN:    1. Breast cancer: Lesions on patient's left breast reconstructed left breast confirmed by biopsy to be recurrence of disease. PET scan results as  above reviewed independently and revealed no other metastatic disease. Patient also had consultation with radiation oncology today. Return to clinic in 2 weeks to initiate XRT along with concurrent chemotherapy using weekly cisplatin 20 mg/m2.   2. Neutropenia: Resolved.  3. Axillary tenderness: Likely related to scar tissue. PET scan as above with no evidence of recurrence. 4. Peripheral neuropathy: Monitor.   Approximately 30 minutes was spent in discussion and consultation.    Patient expressed understanding and was in agreement with this plan. She also understands that She can call clinic at any time with any questions, concerns, or complaints.   Inflammatory carcinoma of breast, estrogen receptor positive, stage 4   Staging form: Breast, AJCC 7th Edition     Clinical stage from 03/30/2015: Stage IV (T4b, N0, M1) - Signed by Lloyd Huger, MD on 03/30/2015   Lloyd Huger, MD   03/30/2015 9:24 AM

## 2015-04-02 ENCOUNTER — Telehealth: Payer: Self-pay

## 2015-04-02 NOTE — Telephone Encounter (Signed)
Michelle Dawson called patient to inform her of the radiation appt for Tuesday and the patient wanted her to inform Dr. Grayland Ormond that she is going to [facility] for further treatment./Abb Gobert S

## 2015-04-03 ENCOUNTER — Ambulatory Visit
Admission: RE | Admit: 2015-04-03 | Discharge: 2015-04-03 | Disposition: A | Discharge status: Home / Self Care | Payer: 59 | Source: Ambulatory Visit | Attending: Radiation Oncology | Admitting: Radiation Oncology

## 2015-04-03 ENCOUNTER — Ambulatory Visit: Payer: 59

## 2015-04-18 ENCOUNTER — Telehealth: Payer: Self-pay | Admitting: *Deleted

## 2015-04-18 MED ORDER — ONDANSETRON HCL 8 MG PO TABS: 8.0000 mg | ORAL_TABLET | Freq: Four times a day (QID) | ORAL | Status: AC | PRN

## 2015-04-18 NOTE — Telephone Encounter (Signed)
ecsribed

## 2015-04-19 ENCOUNTER — Other Ambulatory Visit: Payer: Self-pay | Admitting: Family Medicine

## 2015-04-23 ENCOUNTER — Other Ambulatory Visit: Payer: Self-pay | Admitting: Family Medicine

## 2015-04-23 MED ORDER — PSEUDOEPHEDRINE HCL ER 240 MG PO TB24: 240.0000 mg | ORAL_TABLET | Freq: Every day | ORAL | Status: DC | PRN

## 2015-04-23 MED ORDER — HYDROCODONE-ACETAMINOPHEN 5-500 MG PO CAPS: 1.0000 | ORAL_CAPSULE | Freq: Two times a day (BID) | ORAL | Status: DC

## 2015-04-23 NOTE — Progress Notes (Signed)
Patient requested rx for hydrocodone-acetaminophen. Printed. Patient is requesting a prescription for Sudafed

## 2015-06-29 IMAGING — CR DG CHEST 2V
1 series · 2 of 2 positions shown · non-contrast
Comparison: None.

CLINICAL DATA: Breast cancer.

EXAM:
CHEST  2 VIEW

[Series 1: w chest pa · 0.14mm/px · 2 of 2 slices shown]
[im 1/2]
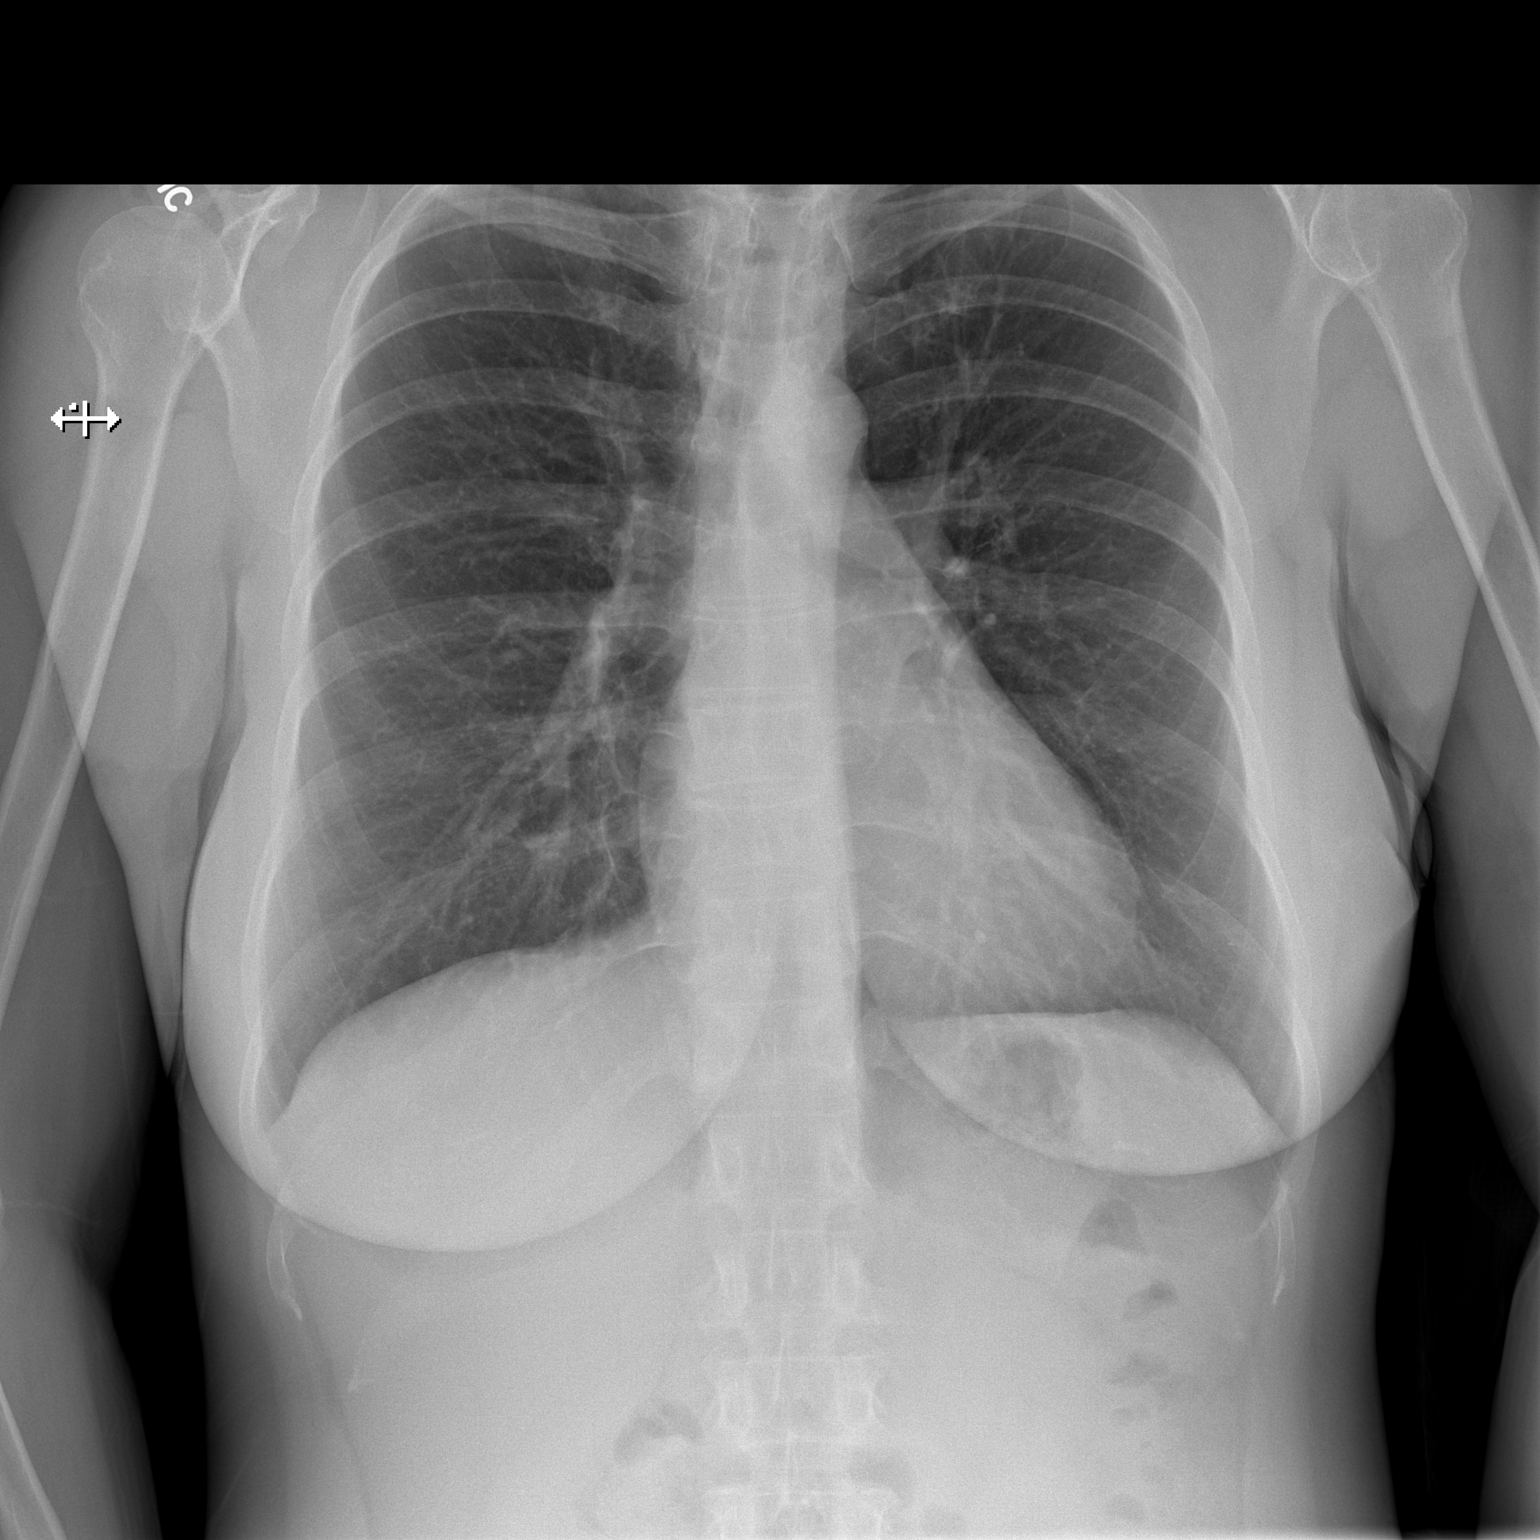
[im 2/2]
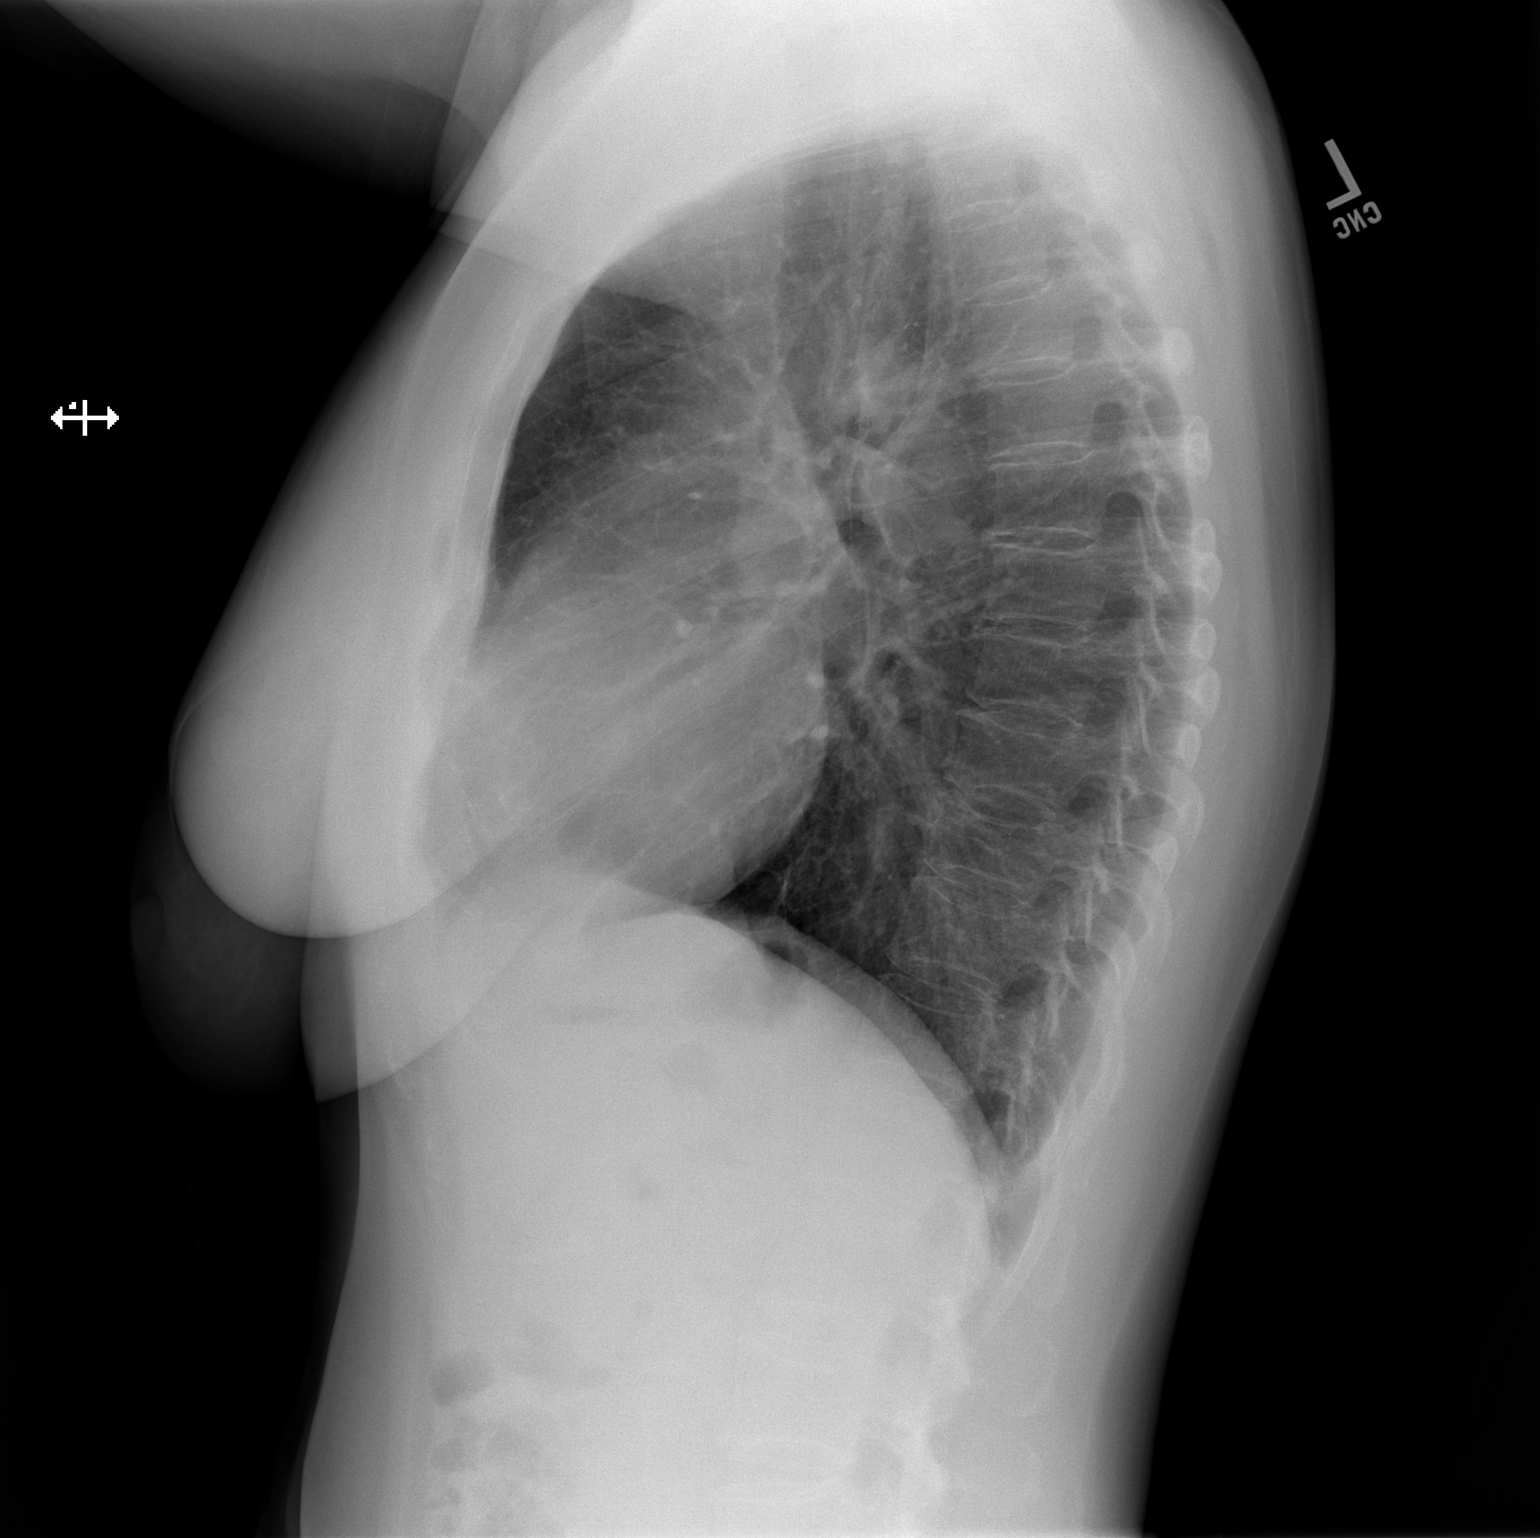

[2 of 2 positions shown; findings below may reference images not displayed]

FINDINGS: The heart size and mediastinal contours are within normal limits.
Both lungs are clear. The visualized skeletal structures are
unremarkable.
IMPRESSION: No acute cardiopulmonary abnormality seen.

## 2015-07-03 ENCOUNTER — Other Ambulatory Visit: Payer: Self-pay | Admitting: Family Medicine

## 2015-07-03 DIAGNOSIS — C50912 Malignant neoplasm of unspecified site of left female breast: Secondary | ICD-10-CM

## 2015-07-03 DIAGNOSIS — Z170 Estrogen receptor positive status [ER+]: Principal | ICD-10-CM

## 2015-07-03 NOTE — Telephone Encounter (Signed)
Pt contacted office for refill request on the following medications:  hydrocodone-acetaminophen (LORCET-HD) 5-500 MG.  TJ#[phone]/[id number]  This is Dr [name] pt/MW

## 2015-07-03 NOTE — Telephone Encounter (Signed)
Please review. Thanks!  

## 2015-07-04 MED ORDER — HYDROCODONE-ACETAMINOPHEN 5-500 MG PO CAPS: 1.0000 | ORAL_CAPSULE | Freq: Two times a day (BID) | ORAL | Status: DC

## 2015-07-04 NOTE — Telephone Encounter (Signed)
Printed prescription as ordered per MD.   Thanks,

## 2015-07-04 NOTE — Telephone Encounter (Signed)
I assume this is for her breast cancer. Okay to print a months refills. I cannot tell how well as written.

## 2015-07-04 NOTE — Telephone Encounter (Signed)
Advised pt for prescription ready to pick up.  Thanks,

## 2015-08-07 ENCOUNTER — Other Ambulatory Visit: Payer: Self-pay | Admitting: Family Medicine

## 2015-08-07 DIAGNOSIS — J309 Allergic rhinitis, unspecified: Secondary | ICD-10-CM | POA: Insufficient documentation

## 2015-08-07 DIAGNOSIS — G4700 Insomnia, unspecified: Secondary | ICD-10-CM | POA: Insufficient documentation

## 2015-08-09 ENCOUNTER — Other Ambulatory Visit: Payer: Self-pay | Admitting: Family Medicine

## 2015-08-09 DIAGNOSIS — C50912 Malignant neoplasm of unspecified site of left female breast: Secondary | ICD-10-CM

## 2015-08-09 DIAGNOSIS — Z170 Estrogen receptor positive status [ER+]: Principal | ICD-10-CM

## 2015-08-09 MED ORDER — HYDROCODONE-ACETAMINOPHEN 5-500 MG PO CAPS: 1.0000 | ORAL_CAPSULE | Freq: Two times a day (BID) | ORAL | Status: AC

## 2015-08-09 NOTE — Telephone Encounter (Signed)
Printed.  Thanks.  

## 2015-08-09 NOTE — Telephone Encounter (Signed)
Pt contacted office for refill request on the following medications:  hydrocodone-acetaminophen (LORCET-HD) 5-500 MG.  LJ#[phone]/QR

## 2015-08-13 ENCOUNTER — Ambulatory Visit: Payer: 59

## 2015-08-13 ENCOUNTER — Ambulatory Visit
Admission: EM | Admit: 2015-08-13 | Discharge: 2015-08-13 | Disposition: A | Discharge status: Home / Self Care | Payer: 59 | Attending: Family Medicine | Admitting: Family Medicine

## 2015-08-13 ENCOUNTER — Encounter: Payer: Self-pay | Admitting: Emergency Medicine

## 2015-08-13 DIAGNOSIS — S92412D Displaced fracture of proximal phalanx of left great toe, subsequent encounter for fracture with routine healing: Secondary | ICD-10-CM | POA: Diagnosis not present

## 2015-08-13 MED ORDER — HYDROCODONE-ACETAMINOPHEN 5-325 MG PO TABS: 1.0000 | ORAL_TABLET | Freq: Three times a day (TID) | ORAL | Status: DC | PRN

## 2015-08-13 NOTE — ED Provider Notes (Signed)
CSN: [id number]     Arrival date & time 08/13/15  1010 History   First MD Initiated Contact with Patient 08/13/15 1105     Chief Complaint  Patient presents with  . Toe Pain   (Consider location/radiation/quality/duration/timing/severity/associated sxs/prior Treatment) Patient is a 54 y.o. female presenting with toe pain. The history is provided by the patient. No language interpreter was used.  Toe Pain This is a new problem. The current episode started yesterday. The problem occurs constantly. The problem has been gradually worsening. Pertinent negatives include no chest pain, no abdominal pain and no headaches. Nothing relieves the symptoms. She has tried a cold compress for the symptoms. The treatment provided mild relief.    Past Medical History  Diagnosis Date  . 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 The area of DCIS spanning about 3.5 cm in diameter. Margins were negative I 0.5 cm for the invasive cancer in less than 1 mm from the DCIS in multiple locations.  . Carpal tunnel syndrome 2001    nerve damage both arms  . Fibromyalgia 2001  . Sciatica 2012    Dr. [name] referred patient to pain clinic  . Arthritis 2004  . Breast screening, unspecified   . Malignant neoplasm of upper-outer quadrant of female breast 2011    treated with left breast wide excision, mastoplasty, and sentinel node biopsy for a T1b, N0, M0; The patient Oncotype score was low at 8% with hormone treatment alone. She did not receive adjuvant chemotherapy. She has been managed with tamoxifen alone.  . 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  . 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 Carpal tunnel release  2001  . Breast surgery Left  2011    left breast wide excision, mastoplasty, and sentinel node biopsy  . Breast biopsy Left 09/12/2013    Left breast stereotactic  . Breast reconstruction Left 10/26/13  . Reconstruction breast w/ latissimus dorsi flap Left October 20, 2013    simple mastectomy sentinel node biopsy followed by a reconstruction by Aaron Edelman Coan,MD  . Portacath placement  05/11/14  . Port-a-cath removal  Dec 2015  . Axillary lymph node biopsy Right June 2015   Family History  Problem Relation Age of Onset  . Melanoma Father     malignant melanoma  . Cancer Father     bladder  . Alzheimer's disease Mother   . Cancer Mother     breast   Social History  Substance Use Topics  . Smoking status: Never Smoker   . Smokeless tobacco: Never Used  . Alcohol Use: No   OB History    Gravida Para Term Preterm AB TAB SAB Ectopic Multiple Living   5     0 0   5      Obstetric Comments   Age first menstrual cycle 13  Age first pregnancy 18     Review of Systems  Cardiovascular: Negative for chest pain.  Gastrointestinal: Negative for abdominal pain.  Musculoskeletal: Positive for myalgias, joint swelling and gait problem.  Neurological: Negative for headaches.  All other systems reviewed and are negative.   Allergies  Tape  Home Medications   Prior to Admission medications   Medication Sig Start Date End Date Taking?  Authorizing Provider  amoxicillin (AMOXIL) 500 MG capsule  03/22/15   Historical Provider, MD  Ascorbic Acid (VITAMIN C WITH ROSE HIPS) 1000 MG tablet Take 1,000 mg by mouth 2 (two) times daily.    Historical Provider, MD  Biotin 10 MG TABS Take 5,000 mg by mouth daily.     Historical Provider, MD  Calcium Carbonate-Vitamin D (CALCIUM 500 + D) 500-125 MG-UNIT TABS Take by mouth daily.    Historical Provider, MD  Cholecalciferol (VITAMIN D) 2000 UNITS tablet Take 600 Units by mouth daily.     Historical Provider, MD  Coenzyme Q10 (CO Q 10 PO) Take 1 tablet by mouth daily.    Historical  Provider, MD  folic acid (FOLVITE) 546 MCG tablet Take 400 mcg by mouth daily.    Historical Provider, MD  hydrocodone-acetaminophen (LORCET-HD) 5-500 MG per capsule Take 1 capsule by mouth 2 (two) times daily. 08/09/15   Margarita Rana, MD  IRON PO Take by mouth.    Historical Provider, MD  L-Lysine 1000 MG TABS Take by mouth 2 (two) times daily.    Historical Provider, MD  Multiple Vitamins-Minerals (WOMENS DAILY FORMULA PO) Take by mouth.    Historical Provider, MD  ondansetron (ZOFRAN) 8 MG tablet Take 1 tablet (8 mg total) by mouth every 6 (six) hours as needed for nausea or vomiting. 04/18/15   Lloyd Huger, MD  Probiotic Product (PROBIOTIC DAILY PO) Take by mouth.    Historical Provider, MD  SUDAFED 24 HOUR NON-DROWSY 240 MG TB24 TAKE 1 TABLET (240 MG TOTAL) BY MOUTH DAILY AS NEEDED. 08/07/15   Margarita Rana, MD  tamoxifen (NOLVADEX) 20 MG tablet Take 20 mg by mouth daily. 02/13/15   Historical Provider, MD  vitamin A 10000 UNIT capsule Take 8,000 Units by mouth daily.     Historical Provider, MD  vitamin B-12 (CYANOCOBALAMIN) 1000 MCG tablet Take 2,000 mcg by mouth daily.     Historical Provider, MD  vitamin E 400 UNIT capsule Take 400 Units by mouth daily.    Historical Provider, MD  Zinc Sulfate (ZINC 15 PO) Take 50 mg by mouth daily.     Historical Provider, MD  zolpidem (AMBIEN CR) 12.5 MG CR tablet TAKE 1 TABLET BY MOUTH AT BEDTIME 08/07/15   Margarita Rana, MD   Meds Ordered and Administered this Visit  Medications - No data to display  BP 104/67 mmHg  Pulse 61  Temp(Src) 96 F (35.6 C) (Tympanic)  Resp 16  Ht 5\' 3"  (1.6 m)  Wt 145 lb (65.772 kg)  BMI 25.69 kg/m2  SpO2 100% No data found.   Physical Exam  Constitutional: She is oriented to person, place, and time. She appears well-developed and well-nourished.  HENT:  Head: Normocephalic and atraumatic.  Eyes: Conjunctivae are normal. Pupils are equal, round, and reactive to light.  Musculoskeletal: She exhibits  tenderness.       Left foot: There is decreased range of motion, tenderness and swelling.       Feet:  Marked swelling and ecchymosis present around the proximal phalanx of the second left toe.  Neurological: She is alert and oriented to person, place, and time.  Skin: Skin is warm and dry. There is erythema.  Psychiatric: She has a normal mood and affect.  Vitals reviewed.   ED Course  Procedures (including critical care time)  Labs Review Labs Reviewed - No data to display  Imaging Review Dg Toe 2nd Left  08/13/2015   CLINICAL DATA:  Diamantina Providence  dining room chair last night, left second toe injury, pain, initial encounter.  EXAM: LEFT SECOND TOE  COMPARISON:  None.  FINDINGS: No acute osseous or joint abnormality.  IMPRESSION: No acute osseous or joint abnormality.   Electronically Signed   By: Lorin Picket M.D.   On: 08/13/2015 11:30     Visual Acuity Review  Right Eye Distance:   Left Eye Distance:   Bilateral Distance:    Right Eye Near:   Left Eye Near:    Bilateral Near:         MDM   1. Disp fx of proximal phalanx of left great toe with routine healing     Initial reading of x-ray was negative for fracture. But because was reviewed by me earlier and felt there was a fracture radiologist was contacted and she agreed on second reading there was a fracture of the second proximal phalanx bone. We'll plan to give a prescription for Vicodin which she is used for the past still has some at home to use for pain at night in case her current supply may not last her. Also will buddy tape the 2 toes together and because of concern over it some displacement of the fracture will recommend following up with podiatrist in 2-4 weeks and also a walking boot if the pain is still bad. Patient had trouble using her crutches earlier today.    Frederich Cha, MD 08/13/15 [phone]

## 2015-08-13 NOTE — ED Notes (Signed)
Patient c/o pain in her left 2nd toe since last night.

## 2015-08-13 NOTE — Discharge Instructions (Signed)
Toe Fracture °A toe fracture is a break in the bone of a toe. It may take 6 to 8 weeks for the toe injury to heal. °HOME CARE °· "Buddy taping" is taping the broken toe to the toe next to it. Leave the toes taped together for at least 1 week or as told by your doctor. Change the tape after bathing. Always use a small piece of gauze or cotton between the toes when taping them together. °· Put ice on the injured area. °¨ Put ice in a plastic bag. °¨ Place a towel between your skin and the bag. °¨ Leave the ice on for 15-20 minutes, 03-04 times a day. °· After the first 2 days, put heat on the injured area. Use heat for the next 2 to 3 days. Put a heating pad on the foot or soak the foot in warm water as told by your doctor. °· Keep the foot raised (elevated) above the level of your heart. °· Wear sturdy, supportive shoes. The shoes should not pinch the toes or fit tightly against the toes. °· Use a cast shoe (if prescribed) if the foot is very puffy (swollen). °· Use crutches if you have pain or it hurts too much to walk. °· Only take medicine as told by your doctor. °· Follow up with your doctor as told. °GET HELP RIGHT AWAY IF:  °· There is pain or puffiness that is not helped by medicine. °· The pain does not get better after 1 week. °· The toe is cold when the others are warm. °· The toe loses feeling (numb) or turns white. °· The toe becomes hot and red (inflamed). °MAKE SURE YOU:  °· Understand these instructions. °· Will watch this condition. °· Will get help right away if you are not doing well or get worse. °Document Released: 04/21/2008 Document Revised: 01/26/2012 Document Reviewed: 03/29/2010 °ExitCare® Patient Information ©2015 [employer]. This information is not intended to replace advice given to you by your health care provider. Make sure you discuss any questions you have with your health care provider. ° °

## 2015-08-17 ENCOUNTER — Other Ambulatory Visit: Payer: Self-pay | Admitting: Family Medicine

## 2015-08-17 DIAGNOSIS — C50912 Malignant neoplasm of unspecified site of left female breast: Secondary | ICD-10-CM

## 2015-08-17 MED ORDER — HYDROCODONE-ACETAMINOPHEN 5-325 MG PO TABS: 1.0000 | ORAL_TABLET | Freq: Two times a day (BID) | ORAL | Status: DC | PRN

## 2015-08-17 NOTE — Telephone Encounter (Signed)
Printed.  Please notify patient. Thanks.  

## 2015-08-17 NOTE — Telephone Encounter (Signed)
Pt stated that [facility] wouldn't fill the RX for Hydrocodone because it was written for 5-500 mg and they need a new RX for HYDROcodone-acetaminophen (NORCO) 5-325 MG per tablet. Pt would like to pick this up this afternoon if possible. Thanks TNP

## 2015-08-23 ENCOUNTER — Encounter: Payer: Self-pay | Admitting: Podiatry

## 2015-08-23 ENCOUNTER — Ambulatory Visit (INDEPENDENT_AMBULATORY_CARE_PROVIDER_SITE_OTHER): Payer: 59 | Admitting: Podiatry

## 2015-08-23 ENCOUNTER — Ambulatory Visit: Payer: 59

## 2015-08-23 VITALS — BP 128/80 | HR 75 | Resp 18

## 2015-08-23 DIAGNOSIS — R52 Pain, unspecified: Secondary | ICD-10-CM

## 2015-08-23 DIAGNOSIS — S92912A Unspecified fracture of left toe(s), initial encounter for closed fracture: Secondary | ICD-10-CM | POA: Diagnosis not present

## 2015-08-23 NOTE — Patient Instructions (Signed)
Toe Fracture °A toe fracture is a break in one of the toe bones (phalanges). °CAUSES °This condition may be caused by: °· Dropping a heavy object on your toe. °· Stubbing your toe. °· Overusing your toe or doing repetitive exercise. °· Twisting or stretching your toe out of place. °RISK FACTORS °This condition is more likely to develop in people who: °· Play contact sports. °· Have a bone disease. °· Have a low calcium level. °SYMPTOMS °The main symptoms of this condition are swelling and pain in the toe. The pain may get worse with standing or walking. Other symptoms include: °· Bruising. °· Stiffness. °· Numbness. °· A change in the way the toe looks. °· Broken bones that poke through the skin. °· Blood beneath the toenail. °DIAGNOSIS °This condition is diagnosed with a physical exam. You may also have X-rays. °TREATMENT  °Treatment for this condition depends on the type of fracture and its severity. Treatment may involve: °· Taping the broken toe to a toe that is next to it (buddy taping). This is the most common treatment for fractures in which the bone has not moved out of place (nondisplaced fracture). °· Wearing a shoe that has a wide, rigid sole to protect the toe and to limit its movement. °· Wearing a walking cast. °· Having a procedure to move the toe back into place. °· Surgery. This may be needed: °¨ If there are many pieces of broken bone that are out of place (displaced). °¨ If the toe joint breaks. °¨ If the bone breaks through the skin. °· Physical therapy. This is done to help regain movement and strength in the toe. °You may need follow-up X-rays to make sure that the bone is healing well and staying in position. °HOME CARE INSTRUCTIONS °If You Have a Cast: °· Do not stick anything inside the cast to scratch your skin. Doing that increases your risk of infection. °· Check the skin around the cast every day. Report any concerns to your health care provider. You may put lotion on dry skin around the  edges of the cast. Do not apply lotion to the skin underneath the cast. °· Do not put pressure on any part of the cast until it is fully hardened. This may take several hours. °· Keep the cast clean and dry. °Bathing °· Do not take baths, swim, or use a hot tub until your health care provider approves. Ask your health care provider if you can take showers. You may only be allowed to take sponge baths for bathing. °· If your health care provider approves bathing and showering, cover the cast or bandage (dressing) with a watertight plastic bag to protect it from water. Do not let the cast or dressing get wet. °Managing Pain, Stiffness, and Swelling °· If you do not have a cast, apply ice to the injured area, if directed. °¨ Put ice in a plastic bag. °¨ Place a towel between your skin and the bag. °¨ Leave the ice on for 20 minutes, 2-3 times per day. °· Move your toes often to avoid stiffness and to lessen swelling. °· Raise (elevate) the injured area above the level of your heart while you are sitting or lying down. °Driving °· Do not drive or operate heavy machinery while taking pain medicine. °· Do not drive while wearing a cast on a foot that you use for driving. °Activity °· Return to your normal activities as directed by your health care provider. Ask your health care   provider what activities are safe for you. °· Perform exercises daily as directed by your health care provider or physical therapist. °Safety °· Do not use the injured limb to support your body weight until your health care provider says that you can. Use crutches or other assistive devices as directed by your health care provider. °General Instructions °· If your toe was treated with buddy taping, follow your health care provider's instructions for changing the gauze and tape. Change it more often: °¨ The gauze and tape get wet. If this happens, dry the space between the toes. °¨ The gauze and tape are too tight and cause your toe to become pale  or numb. °· Wear a protective shoe as directed by your health care provider. If you were not given a protective shoe, wear sturdy, supportive shoes. Your shoes should not pinch your toes and should not fit tightly against your toes. °· Do not use any tobacco products, including cigarettes, chewing tobacco, or e-cigarettes. Tobacco can delay bone healing. If you need help quitting, ask your health care provider. °· Take medicines only as directed by your health care provider. °· Keep all follow-up visits as directed by your health care provider. This is important. °SEEK MEDICAL CARE IF: °· You have a fever. °· Your pain medicine is not helping. °· Your toe is cold. °· Your toe is numb. °· You still have pain after one week of rest and treatment. °· You still have pain after your health care provider has said that you can start walking again. °· You have pain, tingling, or numbness in your foot that is not going away. °SEEK IMMEDIATE MEDICAL CARE IF: °· You have severe pain. °· You have redness or inflammation in your toe that is getting worse. °· You have pain or numbness in your toe that is getting worse. °· Your toe turns blue. °  °This information is not intended to replace advice given to you by your health care provider. Make sure you discuss any questions you have with your health care provider. °  °Document Released: 10/31/2000 Document Revised: 07/25/2015 Document Reviewed: 08/30/2014 °Elsevier Interactive Patient Education ©2016 [employer] ° °

## 2015-08-23 NOTE — Progress Notes (Signed)
   Subjective:    Patient ID: Michelle Dawson, female    DOB: Jan 18, 1961, 54 y.o.   MRN: [id number]  HPI   54 year old female presents the office or concerns about possible broken left second toe. She states that last week she hit her toe while walking in the dark on the chair. She was seen in urgent care facility and was given a surgical shoe to wear. X-rays were taken at that time. She was taping the toe to the big toe for additional stability which seems to help. No other complaints at this time.  Review of Systems  All other systems reviewed and are negative.      Objective:   Physical Exam AAO x3, NAD DP/PT pulses palpable bilaterally, CRT less than 3 seconds Protective sensation intact with Simms Weinstein monofilament, vibratory sensation intact, Achilles tendon reflex intact There is tenderness palpation overlying the left second toe distally. This ecchymosis from the distal aspect of the toe. Toenails firmly adhered to the underlying nail bed there is no subungual hematoma. There is mild edema to the toe pain associated erythema or increase in warmth. There is mild tenderness on the second metatarsal head however there is no other areas of pinpoint bony tenderness or pain the vibratory sensation bilaterally. No areas of tenderness to bilateral lower extremities. MMT 5/5, ROM WNL.  No open lesions or pre-ulcerative lesions.  No overlying edema, erythema, increase in warmth to bilateral lower extremities.  No pain with calf compression, swelling, warmth, erythema bilaterally.      Assessment & Plan:   54 year old female with left second digit fracture  -Treatment options discussed including all alternatives, risks, and complications -Previous x-ray was reviewed which revealed fracture of the second digit.  -Continue a surgical shoe.  -Continue with taping of the digit.  -Ice and elevation  -Hold off on running  -Follow-up in 3 weeks or sooner if any problems arise. In the  meantime, encouraged to call the office with any questions, concerns, change in symptoms.   Celesta Gentile, DPM

## 2015-08-27 NOTE — ED Notes (Signed)
Patient received a Norco EX from Dr Alveta Heimlich at [facility] in September, attempting to fill that RX today, pharmacy called that patient's primary MD [name] had written a RX earlier in the month for Norco, Dr Alveta Heimlich aware, RX denied to be refilled by Dr Alveta Heimlich.

## 2015-09-13 ENCOUNTER — Ambulatory Visit (INDEPENDENT_AMBULATORY_CARE_PROVIDER_SITE_OTHER): Payer: 59 | Admitting: Podiatry

## 2015-09-13 ENCOUNTER — Ambulatory Visit (INDEPENDENT_AMBULATORY_CARE_PROVIDER_SITE_OTHER): Payer: 59

## 2015-09-13 ENCOUNTER — Encounter: Payer: Self-pay | Admitting: Podiatry

## 2015-09-13 DIAGNOSIS — R52 Pain, unspecified: Secondary | ICD-10-CM

## 2015-09-13 DIAGNOSIS — S92912D Unspecified fracture of left toe(s), subsequent encounter for fracture with routine healing: Secondary | ICD-10-CM | POA: Diagnosis not present

## 2015-09-13 NOTE — Progress Notes (Signed)
Patient ID: Michelle Dawson, female   DOB: Apr 05, 1961, 54 y.o.   MRN: [id number]  Subjective: 54 year old female presents the office in follow-up evaluation left second toe fracture. She states that the areas remain somewhat bruises although significantly improved compared to what it was previously. Her pain has also decreased. She continues with a surgical shoe as she's been taking the second toe big toe. No recent injury,. It looks like this time in no acute changes.  Objective: AAO x 3, NAD DP/PT pulses palpable, CRT less than 3 seconds There is decreased edema to the left second toe. Is a small amount of ecchymosis the distal aspect of the digit. There is no overlying erythema or increased warmth. The toe just slightly laterally deviated position compared to contralateral extremity. There is very mild tenderness to palpation on the proximal phalanx. There is no tenderness of the metatarsals or to the other digits. There is no other areas of tenderness bilateral lower extremities. They're no open lesions or pre-ulcerative lesions. No pain with calf compression, swelling, warmth, erythema.  Assessment: 54 year old female feeling left second toe proximal phalanx fracture  Plan: -X-rays were obtained and reviewed with the patient. There is increased consolidation across the fracture site. -Treatment options discussed including all alternatives, risks, and complications -Recommend continue taking the toe. Remain in the surgical shoe as if the pain resolves. Pain resolves she can return back into a regular shoe as tolerated. It is any increased pain to return to the regular shoe. -Discussed long-term sequela toe fractures including hammertoes and toe deviation. -Ice/elevation -Follow-up in 4 weeks or sooner if any problems arise. In the meantime, encouraged to call the office with any questions, concerns, change in symptoms.  *X-ray next appointment  Celesta Gentile, DPM

## 2015-10-04 ENCOUNTER — Ambulatory Visit (INDEPENDENT_AMBULATORY_CARE_PROVIDER_SITE_OTHER): Payer: 59 | Admitting: Podiatry

## 2015-10-04 ENCOUNTER — Ambulatory Visit (INDEPENDENT_AMBULATORY_CARE_PROVIDER_SITE_OTHER): Payer: 59

## 2015-10-04 ENCOUNTER — Encounter: Payer: Self-pay | Admitting: Podiatry

## 2015-10-04 VITALS — BP 109/74 | HR 92 | Resp 18

## 2015-10-04 DIAGNOSIS — S92912D Unspecified fracture of left toe(s), subsequent encounter for fracture with routine healing: Secondary | ICD-10-CM

## 2015-10-04 DIAGNOSIS — R52 Pain, unspecified: Secondary | ICD-10-CM

## 2015-10-04 DIAGNOSIS — S92919A Unspecified fracture of unspecified toe(s), initial encounter for closed fracture: Secondary | ICD-10-CM | POA: Insufficient documentation

## 2015-10-04 NOTE — Progress Notes (Signed)
Patient ID: Michelle Dawson, female   DOB: [birth year], 54 y.o.   MRN: [id number]  Subjective: 54 year old female presents the office in follow-up evaluation left second toe fracture. She states that she is doing much better. She has returned to running and she ran 4 miles the other any problems. After the rash had occasional discomfort but it quickly resolves. She has had no swelling or bruising to the toe. She is able to wear regular shoe without any problems. No other complaints at this time in no acute changes. Denies any systemic complaints such as fevers, chills, nausea, vomiting. No calf pain, chest pain, shortness of breath.  Objective: AAO x 3, NAD DP/PT pulses palpable, CRT less than 3 seconds There is trace edema to the left second toe. Ecchymosis around the toe is resolved. There is very mild discomfort to the base of the second toe on the proximal phalanx however this is significantly improved. There is no pain, restriction, crepitation with MPJ range of motion. There is no other areas of tenderness to bilateral lower extremity is. No other areas of edema, erythema, increase in warmth. There are no open lesions or pre-ulcerative lesions.  No pain with calf compression, swelling, warmth, erythema.  Assessment: 54 year old female feeling left second toe proximal phalanx fracture  Plan: -X-rays were obtained and reviewed with the patient. There is increased consolidation across the fracture site. -Treatment options discussed including all alternatives, risks, and complications -She can continue to the toe to the third toe for stabilization during running. Continue with regular activity as tolerated. -Follow-up if symptoms worsen or do not resolve completely in 4 weeks or sooner if any problems arise. In the meantime, encouraged to call the office with any questions, concerns, change in symptoms.   Celesta Gentile, DPM

## 2015-10-22 ENCOUNTER — Telehealth: Payer: Self-pay | Admitting: Family Medicine

## 2015-10-22 DIAGNOSIS — C50A2 Malignant inflammatory neoplasm of left breast: Secondary | ICD-10-CM

## 2015-10-22 DIAGNOSIS — Z170 Estrogen receptor positive status [ER+]: Principal | ICD-10-CM

## 2015-10-22 DIAGNOSIS — C50912 Malignant neoplasm of unspecified site of left female breast: Secondary | ICD-10-CM

## 2015-10-22 MED ORDER — HYDROCODONE-ACETAMINOPHEN 5-325 MG PO TABS: 1.0000 | ORAL_TABLET | Freq: Two times a day (BID) | ORAL | Status: DC | PRN

## 2015-10-22 NOTE — Telephone Encounter (Signed)
Prescription printed. Please notify patient it is ready for pick up. Thanks- Dr. Kayin Osment.  

## 2015-10-22 NOTE — Telephone Encounter (Signed)
Pt contacted office for refill request on the following medications: HYDROcodone-acetaminophen (NORCO) 5-325 MG tablet. Pt stated she would like to get this Wednesday 10/24/15. Thanks TNP

## 2015-12-11 ENCOUNTER — Other Ambulatory Visit: Payer: Self-pay | Admitting: Family Medicine

## 2015-12-11 DIAGNOSIS — G4700 Insomnia, unspecified: Secondary | ICD-10-CM

## 2015-12-11 NOTE — Telephone Encounter (Signed)
Please call in zolpidem  

## 2015-12-21 ENCOUNTER — Other Ambulatory Visit: Payer: Self-pay | Admitting: Family Medicine

## 2015-12-21 DIAGNOSIS — J309 Allergic rhinitis, unspecified: Secondary | ICD-10-CM

## 2016-01-17 IMAGING — NM NM  CARDIAC MUGA REST SCAN 2 0F 2
5 series · 32 of 32 positions shown · non-contrast
Comparison: None.

CLINICAL DATA: Potential cardiac toxic chemotherapy. Cardiac
function evaluation

EXAM:
NUCLEAR MEDICINE CARDIAC BLOOD POOL IMAGING (MUGA)
TECHNIQUE: Cardiac multi-gated acquisition was performed at rest following
intravenous injection of Nc-SSm labeled red blood cells.
RADIOPHARMACEUTICALS:  20.4 ESiXc-22m in-vitro labeled red blood
cells.

[Series 1000: 70 degree-gated · 3.30mm/px · 6 of 24 frames shown]
[frame 3/24]
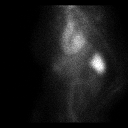
[frame 7/24]
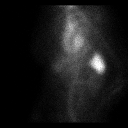
[frame 11/24]
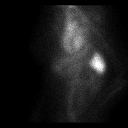
[frame 15/24]
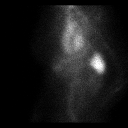
[frame 19/24]
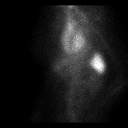
[frame 23/24]
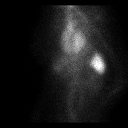

[Series 1000: 45 lao-gated · 3.30mm/px · 6 of 24 frames shown]
[frame 3/24]
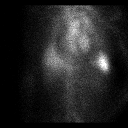
[frame 7/24]
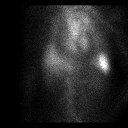
[frame 11/24]
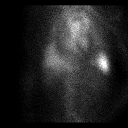
[frame 15/24]
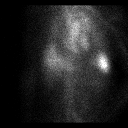
[frame 19/24]
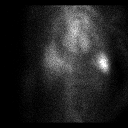
[frame 23/24]
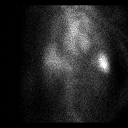

[Series 1000: 45 lao-gated (original with roi) · 3.30mm/px · 6 of 24 frames shown]
[frame 3/24]
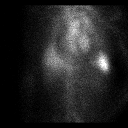
[frame 7/24]
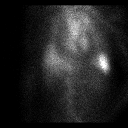
[frame 11/24]
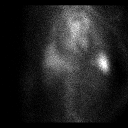
[frame 15/24]
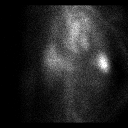
[frame 19/24]
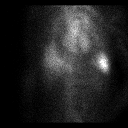
[frame 23/24]
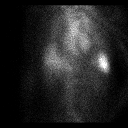

[Series 1000: 45 lao-gated (results) · 3.30mm/px · 6 of 24 frames shown]
[frame 3/24  full-range]
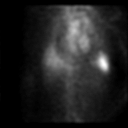
[frame 7/24  full-range]
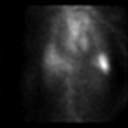
[frame 11/24  full-range]
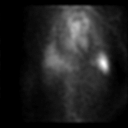
[frame 15/24  full-range]
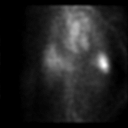
[frame 19/24  full-range]
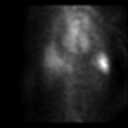
[frame 23/24  full-range]
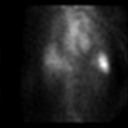

[Series 1000: 45 lao-gated (functional) · 3.30mm/px · 8 of 8 slices shown]
[im 1/8  full-range]
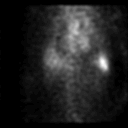
[im 2/8  full-range]
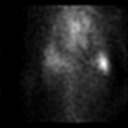
[im 3/8]
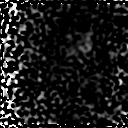
[im 4/8]
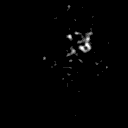
[im 5/8]
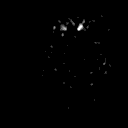
[im 6/8  full-range]
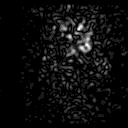
[im 7/8]
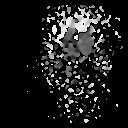
[im 8/8]
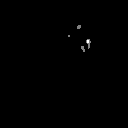

[32 of 32 positions shown; findings below may reference images not displayed]

FINDINGS: Normal left ventricular wall motion.

Calculated left ventricular ejection fraction equals greater than
70%. (Actual calculated ejection fraction equals 76%. Ejection
fractions this high are often overestimated due to the of small
ventricular volume of females).
IMPRESSION: Calculated left ventricular ejection fraction equals greater than
70%.

## 2016-02-05 ENCOUNTER — Other Ambulatory Visit: Payer: Self-pay | Admitting: Family Medicine

## 2016-02-05 DIAGNOSIS — C50912 Malignant neoplasm of unspecified site of left female breast: Secondary | ICD-10-CM

## 2016-02-05 MED ORDER — HYDROCODONE-ACETAMINOPHEN 5-325 MG PO TABS: 1.0000 | ORAL_TABLET | Freq: Two times a day (BID) | ORAL | Status: DC | PRN

## 2016-02-05 NOTE — Telephone Encounter (Signed)
Pt contacted office for refill request on the following medications: HYDROcodone-acetaminophen (NORCO) 5-325 MG tablet. Thanks TNP

## 2016-02-05 NOTE — Telephone Encounter (Signed)
Prescription printed. Please notify patient it is ready for pick up. Thanks- Dr. Yasemin Rabon.  

## 2016-02-25 ENCOUNTER — Other Ambulatory Visit: Payer: Self-pay | Admitting: Family Medicine

## 2016-02-25 DIAGNOSIS — C50912 Malignant neoplasm of unspecified site of left female breast: Secondary | ICD-10-CM

## 2016-02-25 NOTE — Telephone Encounter (Signed)
Pt contacted office for refill request on the following medications:  HYDROcodone-acetaminophen (NORCO) 5-325 MG tablet. [id number]

## 2016-02-25 NOTE — Telephone Encounter (Signed)
Last fill 02/05/16- do not see an office visit in epic since June 2016 was not able to see in alscripts-aa

## 2016-02-25 NOTE — Telephone Encounter (Signed)
Rx was written for 3/21.  Should not be out. Know patient has cancer. Please see if out and would need ov to document need for rx change. Thanks.

## 2016-02-25 NOTE — Telephone Encounter (Signed)
Spoke with patient, she is not out and is not taking anymore then 2 daily as needed, she just wanted to see since she is bringing her daughter tomorrow for appointment if it would be convenient to go ahead and get RX at that time also, advised patient i don't think we can do it early like that but would let you review and discuss with patient tomorrow. Patient did say her oldest daughter may be there to bring the other daughter for appointment if patient not able to come. Please review-aa

## 2016-02-26 MED ORDER — HYDROCODONE-ACETAMINOPHEN 5-325 MG PO TABS: 1.0000 | ORAL_TABLET | Freq: Two times a day (BID) | ORAL | Status: DC | PRN

## 2016-02-26 NOTE — Telephone Encounter (Signed)
Pt informed and voiced understanding of results. Rx signed and up front, ready for pick up.

## 2016-02-26 NOTE — Telephone Encounter (Signed)
Printed rx. Please notify patient can pick up at [place]. Thanks.

## 2016-03-03 ENCOUNTER — Other Ambulatory Visit: Payer: Self-pay | Admitting: Family Medicine

## 2016-03-03 DIAGNOSIS — G4700 Insomnia, unspecified: Secondary | ICD-10-CM

## 2016-03-04 NOTE — Telephone Encounter (Signed)
Printed, please fax or call in to pharmacy. Thank you.   

## 2016-03-04 NOTE — Telephone Encounter (Signed)
Last ov 01/03/13

## 2016-03-06 ENCOUNTER — Other Ambulatory Visit: Payer: Self-pay | Admitting: Family Medicine

## 2016-03-06 DIAGNOSIS — G4700 Insomnia, unspecified: Secondary | ICD-10-CM

## 2016-03-07 NOTE — Telephone Encounter (Signed)
Printed, please fax or call in to pharmacy. Thank you.   

## 2016-04-06 ENCOUNTER — Other Ambulatory Visit: Payer: Self-pay | Admitting: Family Medicine

## 2016-04-06 DIAGNOSIS — J309 Allergic rhinitis, unspecified: Secondary | ICD-10-CM

## 2016-04-16 ENCOUNTER — Other Ambulatory Visit: Payer: Self-pay | Admitting: Family Medicine

## 2016-04-16 DIAGNOSIS — C50912 Malignant neoplasm of unspecified site of left female breast: Secondary | ICD-10-CM

## 2016-04-16 MED ORDER — HYDROCODONE-ACETAMINOPHEN 5-325 MG PO TABS: 1.0000 | ORAL_TABLET | Freq: Two times a day (BID) | ORAL | Status: AC | PRN

## 2016-04-16 NOTE — Telephone Encounter (Signed)
Prescription printed. Please notify patient it is ready for pick up. Thanks- Dr. Mala Gibbard.  

## 2016-04-16 NOTE — Telephone Encounter (Signed)
Pt needs refill HYDROcodone-acetaminophen (NORCO) 5-325 MG tablet  Please call when ready for pick up  [phone]  Thanks teri

## 2016-05-14 ENCOUNTER — Other Ambulatory Visit: Payer: Self-pay | Admitting: Family Medicine

## 2016-08-19 ENCOUNTER — Other Ambulatory Visit: Payer: Self-pay | Admitting: *Deleted

## 2016-08-20 NOTE — Telephone Encounter (Signed)
This patient of Dr. Sharyon Medicus has not been seen since 2014. Needs follow up o.v before any medications can be refilled. Suggest she schedule appointment with Adrianna.

## 2016-08-21 NOTE — Telephone Encounter (Signed)
Patient was notified. Patient stated that she has a new provider at [facility] and thinks [facility] sent rx request to [place] by mistake.

## 2019-12-19 DEATH — deceased
